# Patient Record
Sex: Male | Born: 1980 | Race: Black or African American | Hispanic: No | Marital: Married | Smoking: Former smoker
Health system: Southern US, Community
[De-identification: ages and names within clinical notes are randomized; demographics above are authoritative.]

## PROBLEM LIST (undated history)

## (undated) DIAGNOSIS — K219 Gastro-esophageal reflux disease without esophagitis: Secondary | ICD-10-CM

## (undated) DIAGNOSIS — K589 Irritable bowel syndrome without diarrhea: Secondary | ICD-10-CM

## (undated) DIAGNOSIS — R7303 Prediabetes: Secondary | ICD-10-CM

## (undated) DIAGNOSIS — E7800 Pure hypercholesterolemia, unspecified: Secondary | ICD-10-CM

## (undated) DIAGNOSIS — E669 Obesity, unspecified: Secondary | ICD-10-CM

## (undated) DIAGNOSIS — J302 Other seasonal allergic rhinitis: Secondary | ICD-10-CM

## (undated) DIAGNOSIS — I10 Essential (primary) hypertension: Secondary | ICD-10-CM

## (undated) HISTORY — PX: SHOULDER SURGERY: SHX246

## (undated) HISTORY — PX: HIP SURGERY: SHX245

---

## 2005-03-28 ENCOUNTER — Emergency Department (HOSPITAL_COMMUNITY): Admission: EM | Admit: 2005-03-28 | Discharge: 2005-03-28 | Payer: Self-pay | Admitting: Emergency Medicine

## 2008-06-10 ENCOUNTER — Emergency Department (HOSPITAL_COMMUNITY): Admission: EM | Admit: 2008-06-10 | Discharge: 2008-06-11 | Payer: Self-pay | Admitting: Emergency Medicine

## 2014-06-01 ENCOUNTER — Encounter (HOSPITAL_COMMUNITY): Payer: Self-pay | Admitting: Physical Medicine and Rehabilitation

## 2014-06-01 ENCOUNTER — Emergency Department (HOSPITAL_COMMUNITY)
Admission: EM | Admit: 2014-06-01 | Discharge: 2014-06-01 | Disposition: A | Discharge status: Home / Self Care | Payer: BC Managed Care – PPO | Attending: Emergency Medicine | Admitting: Emergency Medicine

## 2014-06-01 DIAGNOSIS — I10 Essential (primary) hypertension: Secondary | ICD-10-CM | POA: Insufficient documentation

## 2014-06-01 DIAGNOSIS — K6289 Other specified diseases of anus and rectum: Secondary | ICD-10-CM | POA: Diagnosis present

## 2014-06-01 DIAGNOSIS — K5900 Constipation, unspecified: Secondary | ICD-10-CM

## 2014-06-01 DIAGNOSIS — Z720 Tobacco use: Secondary | ICD-10-CM | POA: Insufficient documentation

## 2014-06-01 DIAGNOSIS — K644 Residual hemorrhoidal skin tags: Secondary | ICD-10-CM

## 2014-06-01 HISTORY — DX: Essential (primary) hypertension: I10

## 2014-06-01 MED ORDER — DOCUSATE SODIUM 100 MG PO CAPS: 100.0000 mg | ORAL_CAPSULE | Freq: Two times a day (BID) | ORAL | Status: DC | PRN

## 2014-06-01 MED ORDER — HYDROCORTISONE ACETATE 25 MG RE SUPP
25.0000 mg | Freq: Two times a day (BID) | RECTAL | Status: DC
Start: 2014-06-01 — End: 2016-08-20

## 2014-06-01 NOTE — Discharge Instructions (Signed)
Read the information below.  Use the prescribed medication as directed.  Please discuss all new medications with your pharmacist.  You may return to the Emergency Department at any time for worsening condition or any new symptoms that concern you.    If you develop uncontrolled pain, fevers, or you are unable to pass gas or have a bowel movement, see your doctor or return to the ER for a recheck.    Hemorrhoids Hemorrhoids are swollen veins around the rectum or anus. There are two types of hemorrhoids:   Internal hemorrhoids. These occur in the veins just inside the rectum. They may poke through to the outside and become irritated and painful.  External hemorrhoids. These occur in the veins outside the anus and can be felt as a painful swelling or hard lump near the anus. CAUSES  Pregnancy.   Obesity.   Constipation or diarrhea.   Straining to have a bowel movement.   Sitting for long periods on the toilet.  Heavy lifting or other activity that caused you to strain.  Anal intercourse. SYMPTOMS   Pain.   Anal itching or irritation.   Rectal bleeding.   Fecal leakage.   Anal swelling.   One or more lumps around the anus.  DIAGNOSIS  Your caregiver may be able to diagnose hemorrhoids by visual examination. Other examinations or tests that may be performed include:   Examination of the rectal area with a gloved hand (digital rectal exam).   Examination of anal canal using a small tube (scope).   A blood test if you have lost a significant amount of blood.  A test to look inside the colon (sigmoidoscopy or colonoscopy). TREATMENT Most hemorrhoids can be treated at home. However, if symptoms do not seem to be getting better or if you have a lot of rectal bleeding, your caregiver may perform a procedure to help make the hemorrhoids get smaller or remove them completely. Possible treatments include:   Placing a rubber band at the base of the hemorrhoid to cut off  the circulation (rubber band ligation).   Injecting a chemical to shrink the hemorrhoid (sclerotherapy).   Using a tool to burn the hemorrhoid (infrared light therapy).   Surgically removing the hemorrhoid (hemorrhoidectomy).   Stapling the hemorrhoid to block blood flow to the tissue (hemorrhoid stapling).  HOME CARE INSTRUCTIONS   Eat foods with fiber, such as whole grains, beans, nuts, fruits, and vegetables. Ask your doctor about taking products with added fiber in them (fibersupplements).  Increase fluid intake. Drink enough water and fluids to keep your urine clear or pale yellow.   Exercise regularly.   Go to the bathroom when you have the urge to have a bowel movement. Do not wait.   Avoid straining to have bowel movements.   Keep the anal area dry and clean. Use wet toilet paper or moist towelettes after a bowel movement.   Medicated creams and suppositories may be used or applied as directed.   Only take over-the-counter or prescription medicines as directed by your caregiver.   Take warm sitz baths for 15-20 minutes, 3-4 times a day to ease pain and discomfort.   Place ice packs on the hemorrhoids if they are tender and swollen. Using ice packs between sitz baths may be helpful.   Put ice in a plastic bag.   Place a towel between your skin and the bag.   Leave the ice on for 15-20 minutes, 3-4 times a day.   Do  not use a donut-shaped pillow or sit on the toilet for long periods. This increases blood pooling and pain.  SEEK MEDICAL CARE IF:  You have increasing pain and swelling that is not controlled by treatment or medicine.  You have uncontrolled bleeding.  You have difficulty or you are unable to have a bowel movement.  You have pain or inflammation outside the area of the hemorrhoids. MAKE SURE YOU:  Understand these instructions.  Will watch your condition.  Will get help right away if you are not doing well or get  worse. Document Released: 12/20/1999 Document Revised: 12/09/2011 Document Reviewed: 10/27/2011 [facility] Patient Information 2015 [place], [place]. This information is not intended to replace advice given to you by your health care provider. Make sure you discuss any questions you have with your health care provider.  Constipation Constipation is when a person has fewer than three bowel movements a week, has difficulty having a bowel movement, or has stools that are dry, hard, or larger than normal. As people grow older, constipation is more common. If you try to fix constipation with medicines that make you have a bowel movement (laxatives), the problem may get worse. Long-term laxative use may cause the muscles of the colon to become weak. A low-fiber diet, not taking in enough fluids, and taking certain medicines may make constipation worse.  CAUSES   Certain medicines, such as antidepressants, pain medicine, iron supplements, antacids, and water pills.   Certain diseases, such as diabetes, irritable bowel syndrome (IBS), thyroid disease, or depression.   Not drinking enough water.   Not eating enough fiber-rich foods.   Stress or travel.   Lack of physical activity or exercise.   Ignoring the urge to have a bowel movement.   Using laxatives too much.  SIGNS AND SYMPTOMS   Having fewer than three bowel movements a week.   Straining to have a bowel movement.   Having stools that are hard, dry, or larger than normal.   Feeling full or bloated.   Pain in the lower abdomen.   Not feeling relief after having a bowel movement.  DIAGNOSIS  Your health care provider will take a medical history and perform a physical exam. Further testing may be done for severe constipation. Some tests may include:  A barium enema X-ray to examine your rectum, colon, and, sometimes, your small intestine.   A sigmoidoscopy to examine your lower colon.   A colonoscopy to examine your  entire colon. TREATMENT  Treatment will depend on the severity of your constipation and what is causing it. Some dietary treatments include drinking more fluids and eating more fiber-rich foods. Lifestyle treatments may include regular exercise. If these diet and lifestyle recommendations do not help, your health care provider may recommend taking over-the-counter laxative medicines to help you have bowel movements. Prescription medicines may be prescribed if over-the-counter medicines do not work.  HOME CARE INSTRUCTIONS   Eat foods that have a lot of fiber, such as fruits, vegetables, whole grains, and beans.  Limit foods high in fat and processed sugars, such as french fries, hamburgers, cookies, candies, and soda.   A fiber supplement may be added to your diet if you cannot get enough fiber from foods.   Drink enough fluids to keep your urine clear or pale yellow.   Exercise regularly or as directed by your health care provider.   Go to the restroom when you have the urge to go. Do not hold it.   Only take  over-the-counter or prescription medicines as directed by your health care provider. Do not take other medicines for constipation without talking to your health care provider first.  SEEK IMMEDIATE MEDICAL CARE IF:   You have bright red blood in your stool.   Your constipation lasts for more than 4 days or gets worse.   You have abdominal or rectal pain.   You have thin, pencil-like stools.   You have unexplained weight loss. MAKE SURE YOU:   Understand these instructions.  Will watch your condition.  Will get help right away if you are not doing well or get worse. Document Released: 09/20/2003 Document Revised: 12/27/2012 Document Reviewed: 10/03/2012 [facility] Patient Information 2015 [place], [place]. This information is not intended to replace advice given to you by your health care provider. Make sure you discuss any questions you have with your health care  provider.

## 2014-06-01 NOTE — ED Provider Notes (Signed)
CSN: [id number]     Arrival date & time 06/01/14  1410 History  This chart was scribed for Glenn Dredge, PA-C working with Glenn Nay, MD by Glenn Martin, ED Scribe. This patient was seen in room TR10C/TR10C and the patient's care was started at 2:47 PM.      Chief Complaint  Patient presents with  . Rectal Pain  . Hemorrhoids   The history is provided by the patient. No language interpreter was used.   HPI Comments: Glenn Martin is a 34 y.o. male who presents to the Emergency Department complaining of worsening rectal pain that 3-4 days ago. Pt describes the pain as burning such as dry skin. Pt states that he is constipated and states that when he does have a BM his stools are loose. Pt states that the pain is worse when sitting and having a BM. Pt states that he did notice some blood when wiping a few days ago. Denies fever, chills, abdominal pain or diarrhea. Pt denies Hx of hemorrhoids.   Past Medical History  Diagnosis Date  . Hypertension    History reviewed. No pertinent past surgical history. No family history on file. History  Substance Use Topics  . Smoking status: Current Every Day Smoker  . Smokeless tobacco: Not on file  . Alcohol Use: Yes    Review of Systems  Constitutional: Negative for fever and chills.  Gastrointestinal: Positive for constipation. Negative for vomiting, abdominal pain and diarrhea.  Genitourinary:       Rectal pain.   Allergic/Immunologic: Negative for immunocompromised state.  Hematological: Does not bruise/bleed easily.  Psychiatric/Behavioral: Negative for self-injury.    Allergies  Review of patient's allergies indicates no known allergies.  Home Medications   Prior to Admission medications   Not on File   BP 144/105 mmHg  Pulse 76  Temp(Src) 97.8 F (36.6 C) (Oral)  Resp 18  SpO2 97%   Physical Exam  Constitutional: He appears well-developed and well-nourished. No distress.  HENT:  Head: Normocephalic and atraumatic.   Neck: Neck supple.  Pulmonary/Chest: Effort normal.  Genitourinary: Rectal exam shows external hemorrhoid.  Single soft hemorrhoid at anal verge, no erythema warmth or discharge, no evidence of thrombosis or active bleeding, mildly tender to palpation.  Chaperone Present.   Neurological: He is alert.  Skin: He is not diaphoretic.  Nursing note and vitals reviewed.   ED Course  Procedures (including critical care time) DIAGNOSTIC STUDIES: Oxygen Saturation is 97% on RA, normal by my interpretation.    COORDINATION OF CARE: 3:14 PM-Discussed treatment plan with pt at bedside and pt agreed to plan.     Labs Review Labs Reviewed - No data to display  Imaging Review No results found.   EKG Interpretation None      MDM   Final diagnoses:  External hemorrhoid  Constipation, unspecified constipation type    Afebrile, nontoxic patient with single external hemorrhoid, mostly outside, partially within anal sphincter.  No e/o thrombosis.  No active bleeding.  No e/o infection.   D/C home with anusol, colace, referral to general surgery if needed.   Discussed result, findings, treatment, and follow up  with patient.  Pt given return precautions.  Pt verbalizes understanding and agrees with plan.         I personally performed the services described in this documentation, which was scribed in my presence. The recorded information has been reviewed and is accurate.      Glenn Dredge, PA-C 06/01/14 1702  Glenn Maduro  Glenn PaxBeaton, MD 06/05/14 [phone]

## 2014-06-01 NOTE — ED Notes (Signed)
Pt presents to department for evaluation of rectal pain, increases with bowel movements, also states constipation. Pt is alert and oriented x4.

## 2014-10-05 ENCOUNTER — Emergency Department (HOSPITAL_COMMUNITY)
Admission: EM | Admit: 2014-10-05 | Discharge: 2014-10-05 | Disposition: A | Discharge status: Home / Self Care | Payer: BC Managed Care – PPO | Attending: Emergency Medicine | Admitting: Emergency Medicine

## 2014-10-05 ENCOUNTER — Encounter (HOSPITAL_COMMUNITY): Payer: Self-pay | Admitting: Emergency Medicine

## 2014-10-05 DIAGNOSIS — Z760 Encounter for issue of repeat prescription: Secondary | ICD-10-CM | POA: Diagnosis not present

## 2014-10-05 DIAGNOSIS — H748X3 Other specified disorders of middle ear and mastoid, bilateral: Secondary | ICD-10-CM | POA: Insufficient documentation

## 2014-10-05 DIAGNOSIS — R110 Nausea: Secondary | ICD-10-CM | POA: Diagnosis present

## 2014-10-05 DIAGNOSIS — Z720 Tobacco use: Secondary | ICD-10-CM | POA: Diagnosis not present

## 2014-10-05 DIAGNOSIS — I10 Essential (primary) hypertension: Secondary | ICD-10-CM | POA: Insufficient documentation

## 2014-10-05 DIAGNOSIS — J0110 Acute frontal sinusitis, unspecified: Secondary | ICD-10-CM | POA: Insufficient documentation

## 2014-10-05 MED ORDER — AMOXICILLIN 500 MG PO CAPS: 500.0000 mg | ORAL_CAPSULE | Freq: Three times a day (TID) | ORAL | Status: DC

## 2014-10-05 MED ORDER — LISINOPRIL-HYDROCHLOROTHIAZIDE 20-25 MG PO TABS
1.0000 | ORAL_TABLET | Freq: Every day | ORAL | Status: DC
Start: 2014-10-05 — End: 2015-06-05

## 2014-10-05 NOTE — Discharge Instructions (Signed)
Take the prescribed medication as directed. Follow-up with a primary care physician-- resource guide attached to help with this. Return to the ED for new or worsening symptoms.   Emergency Department Resource Guide 1) Find a Doctor and Pay Out of Pocket Although you won't have to find out who is covered by your insurance plan, it is a good idea to ask around and get recommendations. You will then need to call the office and see if the doctor you have chosen will accept you as a new patient and what types of options they offer for patients who are self-pay. Some doctors offer discounts or will set up payment plans for their patients who do not have insurance, but you will need to ask so you aren't surprised when you get to your appointment.  2) Contact Your Local Health Department Not all health departments have doctors that can see patients for sick visits, but many do, so it is worth a call to see if yours does. If you don't know where your local health department is, you can check in your phone book. The CDC also has a tool to help you locate your state's health department, and many state websites also have listings of all of their local health departments.  3) Find a Walk-in Clinic If your illness is not likely to be very severe or complicated, you may want to try a walk in clinic. These are popping up all over the country in pharmacies, drugstores, and shopping centers. They're usually staffed by nurse practitioners or physician assistants that have been trained to treat common illnesses and complaints. They're usually fairly quick and inexpensive. However, if you have serious medical issues or chronic medical problems, these are probably not your best option.  No Primary Care Doctor: - Call Health Connect at  [phone] - they can help you locate a primary care doctor that  accepts your insurance, provides certain services, etc. - Physician Referral Service- [phone]  Chronic Pain  Problems: Organization         Address  Phone   Notes  [facility]  [phone] Patients need to be referred by their primary care doctor.   Medication Assistance: Organization         Address  Phone   Notes  [facility] Medication [facility] [address] [place] [place], [place] [address] [phone] --Must be a resident of [facility] -- Must have NO insurance coverage whatsoever (no Medicaid/ Medicare, etc.) -- The pt. MUST have a primary care doctor that directs their care regularly and follows them in the community   [facility]  [phone]   [employer]  [phone]    Agencies that provide inexpensive medical care: Organization         Address  Phone   Notes  [facility]  [phone]   [facility]    [phone]   [facility] [address] [place], [place] [address] [phone]   [facility] [address] [place], [place] [phone]   Planned Parenthood    [phone]   [facility]    [phone]   Community Health and [facility]  [address], [place] Phone:  [phone], Fax:  [phone] Hours of Operation:  9 am - 6 pm, M-F.  Also accepts Medicaid/Medicare and self-pay.  [facility]  [address],  [address], [place] Phone: [phone], Fax: [phone]. Hours of Operation:  8:30 am - 5:30 pm, M-F.  Also accepts Medicaid and self-pay.  [facility] [place] [address] [place] Phone: [phone]   [place], [place], [place] [phone], Ext. [phone] Mondays & Thursdays: 7-9 AM.  First 15 patients are seen on a first come, first serve basis.    [place] Providers:  Organization         Address  Phone   Notes  [facility] [address], [place] [phone] Also  accepts self-pay patients.  [facility] [address], [place]  [phone]   [place], [address], [place] [phone]   [facility] [address] [place] [phone]   Lucianne Lei [address], [place]   [phone] Only accepts [place] Access [place] patients after they have their name applied to their card.   Self-Pay (no insurance) in [facility]:  Organization         Address  Phone   Notes  Sickle Cell Patients, [facility] Internal Medicine [place] [phone]   [facility] [place] [phone]   [facility]  [place], [place], [place] [phone]   [facility]/Dr. Osei-Bonsu  [address] [place] [address], [place] or [address], [place] [phone] Phone number for both [place] and [place] locations is the same.  [facility] and [facility] [address] [place] [phone]   [facility] [address], [place] or [address] [phone]   [facility] [address], [place] [phone], phone; [phone], fax Sees patients 1st and 3rd Saturday of every month.  Must not qualify for public or private insurance (i.e. Medicaid, Medicare, [place] Health Choice, Veterans' Benefits)  Household income should be no more than 200% of the poverty level The clinic cannot treat you if you are pregnant or think you are pregnant  Sexually transmitted diseases are not treated at the clinic.    Dental Care: Organization         Address  Phone  Notes  [facility] Department of [facility] [phone] Accepts children up to age 14 who are enrolled in [place] or [place]; pregnant  women with a Medicaid card; and children who have applied for Medicaid or [place] Health Choice, but were declined, whose parents can pay a reduced fee at time of service.  [facility] Department of [facility]  [address], [place] [phone] Accepts children up to age 57 who are enrolled in [place] or [place]; pregnant women with a Medicaid card; and children who have applied for Medicaid or [place] Health Choice, but were declined, whose parents can pay a reduced fee at time of service.  [facility]  [place] [phone] Patients are seen by appointment only. Walk-ins are not accepted. [place] will see patients 65 years of age and older. Monday - Tuesday (8am-5pm) Most Wednesdays (8:30-5pm) $30 per visit, cash only  [place]  [address], [place] [phone] Patients are seen by appointment only. Walk-ins are not accepted. [facility] will see patients 18 years of age and older. °One Wednesday Evening (Monthly: Volunteer Based).  $30 per visit, cash only  °[facility]  [phone] for adults; Children under age 4, call Graduate Pediatric Dentistry at [phone]. Children aged 4-14, please call [phone] to request a pediatric application. ° Dental services are provided in all areas of dental care including fillings, crowns and bridges, complete and partial dentures, implants, gum treatment, root canals, and extractions. Preventive care is also provided. Treatment is provided to both adults and children. °Patients are selected via a lottery and there is often a waiting list. °  °[facility] [address], °[place] ° ([phone] [email or web address].[email or web address] °  °[facility] [address], [place], [place] [phone], Ext. [phone] Second and Fourth Thursday of each month, opens at 6:30 AM; Clinic ends at 9 AM.  Patients are  seen on a first-come first-served basis, and a limited number are seen during each clinic.  ° °[facility] ° [address], [place], [place] [phone]   Eligibility Requirements °You must have lived in [place], [place], or [place] counties for at least the last three months. °  You cannot be eligible for state or federal sponsored healthcare insurance, including Veterans Administration, Medicaid, or Medicare. °  You generally cannot be eligible for healthcare insurance through your employer.  °  How to apply: °Eligibility screenings are held every Tuesday and Wednesday afternoon from 1:00 pm until 4:00 pm. You do not need an appointment for the interview!  °[facility] [address], [place], [place] [phone]   °[facility]  [phone]   °[facility]  [phone]   °[facility]  336-570-6415   ° °Behavioral Health Resources in the Community: °Intensive Outpatient Programs °Organization         Address  Phone  Notes  °[facility] [address], [place], [place] [phone]   °[facility] [address], [place], [place] [phone]   °[facility] [address], [place], [place] ° [phone]   °[facility] [address],  °[place], [place] 1-800-853-5163 or 336-641-4981   °Substance Abuse Resources °Organization         Address  Phone  Notes  °[facility]  336-882-2125   °[facility]  336-784-9470   °[facility]  336-285-9073   °[facility]  336-845-3988   °Residential & Outpatient Substance Abuse Program  1-800-659-3381   °Psychological Services °Organization         Address  Phone  Notes  ° Health  336- [phone]   °[facility]  336- 378-7881   °[facility] [address], [place] 1-800-853-5163 or 336-641-4981   ° °Mobile Crisis  Teams °Organization         Address  Phone  Notes  °[facility], Mobile Crisis Care Unit  1-877-626-1772   °Assertive °Psychotherapeutic Services ° [address] [place], [place] 336-834-9664   °Sharon DeEsch [address] °[place] 336-554-5454   ° °Self-Help/Support Groups °Organization         Address  Phone             Notes  °[facility] [place] -   variety of support groups  336- 336-389-6044 Call for more information  Narcotics Anonymous (NA), [facility] [address], [employer] [place]  2 meetings at this location   Residential Facilities manager         Address  Phone  Notes  [facility] [place],    [place]  [phone]   [facility]  [address] [place] [phone], [place], [place]   [place], [place] [phone] Admissions: 8am-3pm M-F  [facility] [place] [address],    [place], [place] [phone]   [facility] [address] [place], [place], [place]   [facility] [address]  [place], [place]   [facility] - Intensive Outpatient [address], [place], [place]   [facility] [place] [address]  [place], [place] 1-640-386-3300 or [phone]   [facility] ([facility]) [address] [place], [place] Accepts Medicaid  [facility] [place] [address],  [place] 1-646-193-9807 Substance Abuse/Addiction Treatment   [facility] Organization         Address  Phone  Notes  [facility]  [phone]   Domenic Schwab, PhD [address] [place], [place]   ([phone]) [phone] or ([phone]   [facility] [place], [place] [phone]   [facility] [address] [place], [place] [phone]  Insurance/Medicaid/sponsorship through [facility] and Families [address]                                    [place], [place] [phone] [place] [address], [place] [phone]    Dr. Adele Schilder  [phone]   [facility] Dept. 1) [address] [place] 2) [place] 3)  [place] [address], [place] [phone]  [phone]   [place] [phone] or [phone] (After Hours)

## 2014-10-05 NOTE — ED Notes (Signed)
Running nose/stuffed up nose/ sneezing/ no fever/ no chills/ coughing up clear and yellowish mucus.

## 2014-10-05 NOTE — ED Notes (Addendum)
Ptc/o "URI symptoms". Pt states headache, cough with productive yellow phlegm and right ear pain.   Pt also states that he has HTN but couldn't doesn't have a PCP and wants a prescription for Lisinopril.

## 2014-10-05 NOTE — ED Provider Notes (Signed)
CSN: [id number]     Arrival date & time 10/05/14  1303 History  By signing my name below, I, Tanda Rockers, attest that this documentation has been prepared under the direction and in the presence of Sharilyn Sites, PA-C. Electronically Signed: Tanda Rockers, ED Scribe. 10/05/2014. 1:31 PM.  Chief Complaint  Patient presents with  . URI  . Medication Refill   The history is provided by the patient. No language interpreter was used.     HPI Comments: Glenn Martin is a 34 y.o. male with hx HTN who presents to the Emergency Department complaining of URI symptoms x 2-3 days, worsening recently. Pt complains of headache, nasal congestion, rhinorrhea, sneezing, productive cough with yellow phlegm, right ear pain, chills, and nausea. He has had recent contact with similar symptoms with his girlfriend. Denies fever, vomiting, chest pain, SOB, palpitations, dizziness, weakness or any other associated symptoms. Pt has hx of HTN, usually takes Lisinopril-HCTZ and has been out for the past 1.5 months. He does not have a PCP because he recently transferred here for work and would like a prescription.   Past Medical History  Diagnosis Date  . Hypertension    History reviewed. No pertinent past surgical history. No family history on file. Social History  Substance Use Topics  . Smoking status: Current Every Day Smoker  . Smokeless tobacco: None  . Alcohol Use: Yes    Review of Systems  Constitutional: Positive for chills. Negative for fever.  HENT: Positive for congestion, ear pain (Right), rhinorrhea and sneezing.   Respiratory: Positive for cough.   Gastrointestinal: Positive for nausea. Negative for vomiting.  Neurological: Positive for headaches.  All other systems reviewed and are negative.  Allergies  Review of patient's allergies indicates no known allergies.  Home Medications   Prior to Admission medications   Medication Sig Start Date End Date Taking? Authorizing Provider   docusate sodium (COLACE) 100 MG capsule Take 1 capsule (100 mg total) by mouth 2 (two) times daily as needed for mild constipation or moderate constipation. 06/01/14   Trixie Dredge, PA-C  hydrocortisone (ANUSOL-HC) 25 MG suppository Place 1 suppository (25 mg total) rectally 2 (two) times daily. For 7 days 06/01/14   Trixie Dredge, PA-C   Triage Vitals: BP 161/91 mmHg  Pulse 98  Temp(Src) 98.2 F (36.8 C) (Oral)  Resp 19  Ht  (1.803 m)  Wt 292 lb (132.45 kg)  BMI 40.74 kg/m2  SpO2 97%   Physical Exam  Constitutional: He is oriented to person, place, and time. He appears well-developed and well-nourished. No distress.  HENT:  Head: Normocephalic and atraumatic.  Right Ear: Ear canal normal. A middle ear effusion is present.  Left Ear: Ear canal normal. Tympanic membrane is erythematous. A middle ear effusion is present.  Nose: Mucosal edema and rhinorrhea (clear) present. Right sinus exhibits maxillary sinus tenderness and frontal sinus tenderness. Left sinus exhibits maxillary sinus tenderness and frontal sinus tenderness.  Mouth/Throat: Uvula is midline, oropharynx is clear and moist and mucous membranes are normal. No oropharyngeal exudate, posterior oropharyngeal edema, posterior oropharyngeal erythema or tonsillar abscesses.  Eyes: Conjunctivae and EOM are normal. Pupils are equal, round, and reactive to light.  Neck: Normal range of motion and full passive range of motion without pain. Neck supple. No spinous process tenderness and no muscular tenderness present. No rigidity.  Cardiovascular: Normal rate, regular rhythm and normal heart sounds.   Pulmonary/Chest: Effort normal and breath sounds normal. No respiratory distress. He has no wheezes.  Abdominal: Soft. Bowel sounds are normal. There is no tenderness. There is no guarding.  Musculoskeletal: Normal range of motion. He exhibits no edema.  Neurological: He is alert and oriented to person, place, and time.  AAOx3, answering  questions appropriately; equal strength UE and LE bilaterally; CN grossly intact; moves all extremities appropriately without ataxia; no focal neuro deficits or facial asymmetry appreciated  Skin: Skin is warm and dry. He is not diaphoretic.  Psychiatric: He has a normal mood and affect.  Nursing note and vitals reviewed.   ED Course  Procedures (including critical care time)  DIAGNOSTIC STUDIES: Oxygen Saturation is 97% on RA, normal by my interpretation.    COORDINATION OF CARE: 1:31 PM-Discussed treatment plan which includes RX Lisinopril-HCTZ and referral for PCP with pt at bedside and pt agreed to plan.   Labs Review Labs Reviewed - No data to display  Imaging Review No results found.   EKG Interpretation None      MDM   Final diagnoses:  Acute frontal sinusitis, recurrence not specified  Medication refill   34 y.o. M here with URI type symptoms for the past 3 days.  Sick contacts noted at home-- girlfriend. Patient afebrile, non-toxic.  No focal neurologic deficits or clinical signs of meningitis.  Appears to have sinusitis.  Lungs CTAB without wheezes or rhonchi to suggest pneumonia.  VSS without respiratory distress.  Will start on amoxicillin, refill lisinopril-hctz.  Recommended to establish care with PCP, resource guide provided.  Discussed plan with patient, he/she acknowledged understanding and agreed with plan of care.  Return precautions given for new or worsening symptoms.  I personally performed the services described in this documentation, which was scribed in my presence. The recorded information has been reviewed and is accurate.    Garlon Hatchet, PA-C 10/05/14 1351  Alvira Monday, MD 10/08/14 1442

## 2014-10-05 NOTE — ED Notes (Signed)
Pt given discharge  

## 2014-12-30 ENCOUNTER — Emergency Department (HOSPITAL_COMMUNITY)
Admission: EM | Admit: 2014-12-30 | Discharge: 2014-12-30 | Disposition: A | Discharge status: Home / Self Care | Payer: BC Managed Care – PPO | Attending: Emergency Medicine | Admitting: Emergency Medicine

## 2014-12-30 ENCOUNTER — Encounter (HOSPITAL_COMMUNITY): Payer: Self-pay | Admitting: Vascular Surgery

## 2014-12-30 DIAGNOSIS — Y92410 Unspecified street and highway as the place of occurrence of the external cause: Secondary | ICD-10-CM | POA: Insufficient documentation

## 2014-12-30 DIAGNOSIS — I10 Essential (primary) hypertension: Secondary | ICD-10-CM | POA: Diagnosis not present

## 2014-12-30 DIAGNOSIS — S3992XA Unspecified injury of lower back, initial encounter: Secondary | ICD-10-CM | POA: Diagnosis present

## 2014-12-30 DIAGNOSIS — Y998 Other external cause status: Secondary | ICD-10-CM | POA: Insufficient documentation

## 2014-12-30 DIAGNOSIS — Z79899 Other long term (current) drug therapy: Secondary | ICD-10-CM | POA: Insufficient documentation

## 2014-12-30 DIAGNOSIS — M545 Low back pain, unspecified: Secondary | ICD-10-CM

## 2014-12-30 DIAGNOSIS — Y9389 Activity, other specified: Secondary | ICD-10-CM | POA: Insufficient documentation

## 2014-12-30 DIAGNOSIS — Z792 Long term (current) use of antibiotics: Secondary | ICD-10-CM | POA: Diagnosis not present

## 2014-12-30 DIAGNOSIS — F17210 Nicotine dependence, cigarettes, uncomplicated: Secondary | ICD-10-CM | POA: Diagnosis not present

## 2014-12-30 MED ORDER — METHOCARBAMOL 500 MG PO TABS: 500.0000 mg | ORAL_TABLET | Freq: Two times a day (BID) | ORAL | Status: DC

## 2014-12-30 MED ORDER — IBUPROFEN 800 MG PO TABS: 800.0000 mg | ORAL_TABLET | Freq: Three times a day (TID) | ORAL | Status: DC

## 2014-12-30 NOTE — Discharge Instructions (Signed)
1. Medications: robaxin, ibuprofen, usual home medications °2. Treatment: rest, drink plenty of fluids, gentle stretching as discussed, alternate ice and heat °3. Follow Up: Please followup with your primary doctor in 3 days for discussion of your diagnoses and further evaluation after today's visit; if you do not have a primary care doctor use the resource guide provided to find one;  Return to the ER for worsening back pain, difficulty walking, loss of bowel or bladder control or other concerning symptoms ° ° ° °Back Exercises °The following exercises strengthen the muscles that help to support the back. They also help to keep the lower back flexible. Doing these exercises can help to prevent back pain or lessen existing pain. °If you have back pain or discomfort, try doing these exercises 2-3 times each day or as told by your health care provider. When the pain goes away, do them once each day, but increase the number of times that you repeat the steps for each exercise (do more repetitions). If you do not have back pain or discomfort, do these exercises once each day or as told by your health care provider. °EXERCISES °Single Knee to Chest °Repeat these steps 3-5 times for each leg: °1. Lie on your back on a firm bed or the floor with your legs extended. °2. Bring one knee to your chest. Your other leg should stay extended and in contact with the floor. °3. Hold your knee in place by grabbing your knee or thigh. °4. Pull on your knee until you feel a gentle stretch in your lower back. °5. Hold the stretch for 10-30 seconds. °6. Slowly release and straighten your leg. °Pelvic Tilt °Repeat these steps 5-10 times: °1. Lie on your back on a firm bed or the floor with your legs extended. °2. Bend your knees so they are pointing toward the ceiling and your feet are flat on the floor. °3. Tighten your lower abdominal muscles to press your lower back against the floor. This motion will tilt your pelvis so your tailbone  points up toward the ceiling instead of pointing to your feet or the floor. °4. With gentle tension and even breathing, hold this position for 5-10 seconds. °Cat-Cow °Repeat these steps until your lower back becomes more flexible: °1. Get into a hands-and-knees position on a firm surface. Keep your hands under your shoulders, and keep your knees under your hips. You may place padding under your knees for comfort. °2. Let your head hang down, and point your tailbone toward the floor so your lower back becomes rounded like the back of a cat. °3. Hold this position for 5 seconds. °4. Slowly lift your head and point your tailbone up toward the ceiling so your back forms a sagging arch like the back of a cow. °5. Hold this position for 5 seconds. °Press-Ups °Repeat these steps 5-10 times: °1. Lie on your abdomen (face-down) on the floor. °2. Place your palms near your head, about shoulder-width apart. °3. While you keep your back as relaxed as possible and keep your hips on the floor, slowly straighten your arms to raise the top half of your body and lift your shoulders. Do not use your back muscles to raise your upper torso. You may adjust the placement of your hands to make yourself more comfortable. °4. Hold this position for 5 seconds while you keep your back relaxed. °5. Slowly return to lying flat on the floor. °Bridges °Repeat these steps 10 times: °1. Lie on your back   on a firm surface. °2. Bend your knees so they are pointing toward the ceiling and your feet are flat on the floor. °3. Tighten your buttocks muscles and lift your buttocks off of the floor until your waist is at almost the same height as your knees. You should feel the muscles working in your buttocks and the back of your thighs. If you do not feel these muscles, slide your feet 1-2 inches farther away from your buttocks. °4. Hold this position for 3-5 seconds. °5. Slowly lower your hips to the starting position, and allow your buttocks muscles to  relax completely. °If this exercise is too easy, try doing it with your arms crossed over your chest. °Abdominal Crunches °Repeat these steps 5-10 times: °1. Lie on your back on a firm bed or the floor with your legs extended. °2. Bend your knees so they are pointing toward the ceiling and your feet are flat on the floor. °3. Cross your arms over your chest. °4. Tip your chin slightly toward your chest without bending your neck. °5. Tighten your abdominal muscles and slowly raise your trunk (torso) high enough to lift your shoulder blades a tiny bit off of the floor. Avoid raising your torso higher than that, because it can put too much stress on your low back and it does not help to strengthen your abdominal muscles. °6. Slowly return to your starting position. °Back Lifts °Repeat these steps 5-10 times: °1. Lie on your abdomen (face-down) with your arms at your sides, and rest your forehead on the floor. °2. Tighten the muscles in your legs and your buttocks. °3. Slowly lift your chest off of the floor while you keep your hips pressed to the floor. Keep the back of your head in line with the curve in your back. Your eyes should be looking at the floor. °4. Hold this position for 3-5 seconds. °5. Slowly return to your starting position. °SEEK MEDICAL CARE IF: °· Your back pain or discomfort gets much worse when you do an exercise. °· Your back pain or discomfort does not lessen within 2 hours after you exercise. °If you have any of these problems, stop doing these exercises right away. Do not do them again unless your health care provider says that you can. °SEEK IMMEDIATE MEDICAL CARE IF: °· You develop sudden, severe back pain. If this happens, stop doing the exercises right away. Do not do them again unless your health care provider says that you can. °  °This information is not intended to replace advice given to you by your health care provider. Make sure you discuss any questions you have with your health  care provider. °  °Document Released: 01/30/2004 Document Revised: 09/12/2014 Document Reviewed: 02/15/2014 °Elsevier Interactive Patient Education ©2016 Elsevier Inc. ° ° ° °Emergency Department Resource Guide °1) Find a Doctor and Pay Out of Pocket °Although you won't have to find out who is covered by your insurance plan, it is a good idea to ask around and get recommendations. You will then need to call the office and see if the doctor you have chosen will accept you as a new patient and what types of options they offer for patients who are self-pay. Some doctors offer discounts or will set up payment plans for their patients who do not have insurance, but you will need to ask so you aren't surprised when you get to your appointment. ° °2) Contact Your Local Health Department °Not all health departments have doctors   that can see patients for sick visits, but many do, so it is worth a call to see if yours does. If you don't know where your local health department is, you can check in your phone book. The CDC also has a tool to help you locate your state's health department, and many state websites also have listings of all of their local health departments. ° °3) Find a Walk-in Clinic °If your illness is not likely to be very severe or complicated, you may want to try a walk in clinic. These are popping up all over the country in pharmacies, drugstores, and shopping centers. They're usually staffed by nurse practitioners or physician assistants that have been trained to treat common illnesses and complaints. They're usually fairly quick and inexpensive. However, if you have serious medical issues or chronic medical problems, these are probably not your best option. ° °No Primary Care Doctor: °- Call [facility] at  832-8000 - they can help you locate a primary care doctor that  accepts your insurance, provides certain services, etc. °- Physician Referral Service- 1-800-533-3463 ° °Chronic Pain  Problems: °Organization         Address  Phone   Notes  °[facility]  (336) 297-2271 Patients need to be referred by their primary care doctor.  ° °Medication Assistance: °Organization         Address  Phone   Notes  °[place] [facility] [address] °[place], [place] [address] (336) 641-8030 --Must be a resident of [place] °-- Must have NO insurance coverage whatsoever (no Medicaid/ Medicare, etc.) °-- The pt. MUST have a primary care doctor that directs their care regularly and follows them in the community °  °[facility]  (866) 331-1348   °[employer]  (888) 892-1162   ° °Agencies that provide inexpensive medical care: °Organization         Address  Phone   Notes  °[facility]  (336) 832-8035   °[facility]    (336) 832-7272   °[facility] [address] °[place], [place] [address] (336) 832-4777   °[facility] [address], °[place] (336) 271-4999   °[facility]    (336) 373-0678   °[facility]    (336) 272-1050   °[facility] ° [address], [place] Phone:  (336) 832-4444, Fax:  (336) 832-4440 Hours of Operation:  9 am - 6 pm, M-F.  Also accepts Medicaid/Medicare and self-pay.  °[facility] ° [address], [place] Phone: (336) 832-3150, Fax: (336) [phone]. Hours of Operation:  8:30 am - 5:30 pm, M-F.  Also accepts Medicaid and self-pay.  °[facility] [place] [address], [place] Phone: (336) 878-6027   °[facility] [address], [place], [place] (336)723-1848, Ext. 123 Mondays & Thursdays: 7-9 AM.  First 15 patients are seen on a first come, first serve basis. °  ° °Medicaid-accepting [place] Providers: ° °Organization         Address  Phone   Notes  °[facility] [address], [place] (336) 641-2100 Also  accepts self-pay patients.  °[facility] [address], [place] ° (336) 856-9996   °[facility] [address],  (336) 288-8857   °[facility] [address] [place] [address], [place] (336) 299-7000   °Veita Bland [address], [place]  ° (336) 373-1557 Only accepts [place] Access Medicaid patients after they have their name applied to their card.  ° °Self-Pay (no insurance) in [place]: ° °Organization         Address  Phone   Notes  °Sickle Cell Patients, [facility] [address], [place] (336) 832-1970   °[facility] [address], [place] (336) 832-4400   °[facility] ° [address], [place] (336) 992-4800   °[facility]/Dr. Osei-Bonsu ° [address] [place] [address], [place] or [address], [place] (336) 841-8500 Phone number for both [place] and [place] locations is the same.  °[facility] [address], [place] (336) 299-0000   °[facility] [address] [place] [address], [place] or [address] (336) 852-7530 °(336) 878-2260   °[facility] [address], [place] (336) 350-1642, phone; (336) 294-5005, fax Sees patients 1st and 3rd Saturday of every month.  Must not qualify for public or private insurance (i.e. Medicaid, Medicare, [place] Health Choice, Veterans' Benefits) • Household income should be no more than 200% of the poverty level •The clinic cannot treat you if you are pregnant or think you are pregnant • Sexually transmitted diseases are not treated at the clinic.  ° ° °Dental Care: °Organization         Address  Phone  Notes  °[facility] [address], [place] (336) 641-6152 Accepts children up to age 21 who are enrolled in Medicaid or [place] Health Choice; pregnant  women with a Medicaid card; and children who have applied for Medicaid or [place] Health Choice, but were declined, whose parents can pay a reduced fee at time of service.  °[facility] [place]  [address], [place] (336) 641-7733 Accepts children up to age 21 who are enrolled in Medicaid or [place] Health Choice; pregnant women with a Medicaid card; and children who have applied for Medicaid or [place] Health Choice, but were declined, whose parents can pay a reduced fee at time of service.  °[facility] ° [address], [place] (336) 641-4533 Patients are seen by appointment only. Walk-ins are not accepted. [facility] will see patients 18 years of age and older. °Monday - Tuesday (8am-5pm) °Most Wednesdays (8:30-5pm) °$30 per visit, cash only  °[facility] ° [address], [place] (336) 641-4533 Patients are seen by appointment only. Walk-ins are not accepted. [facility] will see patients 18 years of age and older. °One Wednesday Evening (Monthly: Volunteer Based).  $30 per visit, cash only  °[facility]  (919) 537-3737 for adults; Children under age 4, call Graduate Pediatric Dentistry at (919) [phone]. Children aged 4-14, please call (919) 537-3737 to request a pediatric application. ° Dental services are provided in all areas of dental care including fillings, crowns and bridges, complete and partial dentures, implants, gum treatment, root canals, and extractions. Preventive care is also provided. Treatment is provided to both adults and children. °Patients are selected via a lottery and there is often a waiting list. °  °[facility] [address], ° ° (336) 763-8833 [email or web address] °  °[facility] [address], [place], [place] (336)723-1848, Ext. 123 Second and Fourth Thursday of each month, opens at 6:30 AM; Clinic ends at 9 AM.  Patients are  seen on a first-come first-served basis, and a limited number are seen during each clinic.  ° °[facility] ° [address], [place], [place] (336) 723-7904   Eligibility Requirements °You must have lived in [place], or [place] counties for at least the last three months. °  You cannot be eligible for state or federal sponsored healthcare insurance, including Veterans Administration, Medicaid, or Medicare. °  You generally cannot be eligible for healthcare insurance through your employer.  °  How to apply: °Eligibility screenings are held every Tuesday and Wednesday afternoon from 1:00 pm until 4:00 pm. You do not need an appointment for the interview!  °[facility] [address], [place], [place] 336-631-2330   °[facility]  336-342-8273   °[facility]  336-703-3100   °[facility]  336-570-6415   ° °Behavioral Health Resources in the Community: °Intensive Outpatient Programs °Organization         Address  Phone  Notes  °[facility] [address], [place], [place] 336-878-6098   °[facility] [address], [place], [place] 336-832-9800   °[facility] [address], [place], [place] ° 336-882-2125   °[facility] [address],  °[place], [place] 1-800-853-5163 or 336-641-4981   °Substance Abuse Resources °Organization         Address  Phone  Notes  °[facility]  336-882-2125   °[facility]  336-784-9470   °[facility]  336-285-9073   °[facility]  336-845-3988   °Residential & Outpatient Substance Abuse Program  1-800-659-3381   °Psychological Services °Organization         Address  Phone  Notes  °[facility]  336- 832-9600   °[facility]  336- 378-7881   °[facility] [address], [place] 1-800-853-5163 or 336-641-4981   ° °Mobile Crisis  Teams °Organization         Address  Phone  Notes  °[facility], Mobile Crisis Care Unit  1-877-626-1772   °Assertive °[facility] ° [address] [place], [place] 336-834-9664   °Sharon DeEsch [address] °[place] 336-554-5454   ° °Self-Help/Support Groups °Organization         Address  Phone             Notes  °[facility] [place] - variety of support groups  336- 373-1402 Call for more information  °Narcotics Anonymous (NA), [facility] [address], °[place]  2 meetings at this location  ° °Residential Treatment Programs °Organization         Address  Phone  Notes  °[facility] [address],    °[place]  1-866-801-8205   °[facility] ° [address], [place], [place] 704-293-8524   °[facility] [address], [place] 336-845-3988 Admissions: 8am-3pm M-F  °[facility] [address]    °[place], [place] 336-841-1104   °[facility] [address], [place], [place] 336-379-7146   °[facility] [address]  °[place],  336-285-9073   °[facility] Intensive Outpatient [address], [place],  336-852-3033   °  [facility] [address]  °[place], [place] 1-877-615-2722 or 336-784-9470   °[facility] [address] [place], [place] 336-227-7417 Accepts Medicaid  °[facility] [address]  °[place] 1-800-659-3381 Substance Abuse/Addiction Treatment  ° °[place] Behavioral Health Resources °Organization         Address  Phone  Notes  °[facility]  (888) 581-9988   °Julie Brannon, PhD [address] [place], [place]   (336) 349-5553 or (336) 951-0000   °[facility]   [address] °[place], [place] (336) 349-4454   °[facility] [address], [place], [place] (336) 342-8316  Insurance/Medicaid/sponsorship through [facility]  °[facility] [address]                                    [place], [place] (336) 342-8316 Therapy/tele-psych/case  °[facility] [address]  ° [place], [place] (336) 349-2233    °Dr. Arfeen  (336) 349-4544   °[facility] [place]  [employer] [place] [facility] 1) [address],  °2) [address], [place] °3)  [address], [place] (336) 349-3220 °(336) 342-7768 ° °(336) 342-8140   °[place] [facility] (336) 342-1394 or (336) 342-3537 (After Hours)    ° ° ° ° °

## 2014-12-30 NOTE — ED Notes (Signed)
Declined W/C at D/C and was escorted to lobby by RN. 
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
## 2015-03-03 ENCOUNTER — Encounter (HOSPITAL_COMMUNITY): Payer: Self-pay | Admitting: *Deleted

## 2015-03-03 ENCOUNTER — Emergency Department (HOSPITAL_COMMUNITY)
Admission: EM | Admit: 2015-03-03 | Discharge: 2015-03-03 | Disposition: A | Discharge status: Home / Self Care | Payer: BC Managed Care – PPO | Attending: Emergency Medicine | Admitting: Emergency Medicine

## 2015-03-03 ENCOUNTER — Emergency Department (HOSPITAL_COMMUNITY): Payer: BC Managed Care – PPO

## 2015-03-03 DIAGNOSIS — X500XXA Overexertion from strenuous movement or load, initial encounter: Secondary | ICD-10-CM | POA: Insufficient documentation

## 2015-03-03 DIAGNOSIS — F17210 Nicotine dependence, cigarettes, uncomplicated: Secondary | ICD-10-CM | POA: Diagnosis not present

## 2015-03-03 DIAGNOSIS — Z792 Long term (current) use of antibiotics: Secondary | ICD-10-CM | POA: Diagnosis not present

## 2015-03-03 DIAGNOSIS — I10 Essential (primary) hypertension: Secondary | ICD-10-CM | POA: Insufficient documentation

## 2015-03-03 DIAGNOSIS — Y998 Other external cause status: Secondary | ICD-10-CM | POA: Insufficient documentation

## 2015-03-03 DIAGNOSIS — Z79899 Other long term (current) drug therapy: Secondary | ICD-10-CM | POA: Insufficient documentation

## 2015-03-03 DIAGNOSIS — S4991XA Unspecified injury of right shoulder and upper arm, initial encounter: Secondary | ICD-10-CM | POA: Insufficient documentation

## 2015-03-03 DIAGNOSIS — Y9389 Activity, other specified: Secondary | ICD-10-CM | POA: Diagnosis not present

## 2015-03-03 DIAGNOSIS — Y9289 Other specified places as the place of occurrence of the external cause: Secondary | ICD-10-CM | POA: Diagnosis not present

## 2015-03-03 MED ORDER — IBUPROFEN 800 MG PO TABS: 800.0000 mg | ORAL_TABLET | Freq: Three times a day (TID) | ORAL | Status: DC

## 2015-03-03 MED ORDER — TRAMADOL HCL 50 MG PO TABS: 50.0000 mg | ORAL_TABLET | Freq: Two times a day (BID) | ORAL | Status: DC | PRN

## 2015-03-03 MED ORDER — KETOROLAC TROMETHAMINE 60 MG/2ML IM SOLN
60.0000 mg | Freq: Once | INTRAMUSCULAR | Status: AC
  Administered 2015-03-03: 60 mg via INTRAMUSCULAR
  Filled 2015-03-03: qty 2

## 2015-03-03 MED ORDER — CYCLOBENZAPRINE HCL 10 MG PO TABS: 10.0000 mg | ORAL_TABLET | Freq: Three times a day (TID) | ORAL | Status: DC | PRN

## 2015-03-03 NOTE — Progress Notes (Signed)
Orthopedic Tech Progress Note Patient Details:  Glenn Martin [birth year] [id number]  Ortho Devices Type of Ortho Device: Shoulder immobilizer Ortho Device/Splint Interventions: Application   [name] 03/03/2015, 5:19 PM

## 2015-03-03 NOTE — Discharge Instructions (Signed)
How to Use a Shoulder Immobilizer A shoulder immobilizer is a device that you may have to wear after a shoulder injury or surgery. This device keeps your arm from moving. This prevents additional pain or injury. It also supports your arm next to your body as your shoulder heals. You may need to wear a shoulder immobilizer to treat a broken bone (fracture) in your shoulder. You may also need to wear one if you have an injury that moves your shoulder out of position (dislocation). There are different types of shoulder immobilizers. The one that you get depends on your injury. RISKS AND COMPLICATIONS Wearing a shoulder immobilizer in the wrong way can let your injured shoulder move around too much. This may delay healing and make your pain and swelling worse. HOW TO USE YOUR SHOULDER IMMOBILIZER  The part of the immobilizer that goes around your neck (sling) should support your upper arm, with your elbow bent and your lower arm and hand across your chest.  Make sure that your elbow:  Is snug against the back pocket of the sling.  Does not move away from your body.  The strap of the immobilizer should go over your shoulder and support your arm and hand. Your hand should be slightly higher than your elbow. It should not hang loosely over the edge of the sling.  If the long strap has a pad, place it where it is most comfortable on your neck.  Carefully follow your health care provider's instructions for wearing your shoulder immobilizer. Your health care provider may want you to:  Loosen your immobilizer to straighten your elbow and move your wrist and fingers. You may have to do this several times each day. Ask your health care provider when you should do this and how often.  Remove your immobilizer once every day to shower, but limit the movement in your injured arm. Before putting the immobilizer back on, use a towel to dry the area under your arm completely.  Remove your immobilizer to do  shoulder exercises at home as directed by your health care provider.  Wear your immobilizer while you sleep. You may sleep more comfortably if you have your upper body raised on pillows. SEEK MEDICAL CARE IF:  Your immobilizer is not supporting your arm properly.  Your immobilizer gets damaged.  You have worsening pain or swelling in your shoulder, arm, or hand.  Your shoulder, arm, or hand changes color or temperature.  You lose feeling in your shoulder, arm, or hand.   This information is not intended to replace advice given to you by your health care provider. Make sure you discuss any questions you have with your health care provider.   Document Released: 01/30/2004 Document Revised: 05/08/2014 Document Reviewed: 11/29/2013 Elsevier Interactive Patient Education 2016 [employer] Shoulder Sprain A shoulder sprain is a partial or complete tear in one of the tough, fiber-like tissues (ligaments) in the shoulder. The ligaments in the shoulder help to hold the shoulder in place. CAUSES This condition may be caused by:  A fall.  A hit to the shoulder.  A twist of the arm. RISK FACTORS This condition is more likely to develop in:  People who play sports.  People who have problems with balance or coordination. SYMPTOMS Symptoms of this condition include:  Pain when moving the shoulder.  Limited ability to move the shoulder.  Swelling and tenderness on top of the shoulder.  Warmth in the shoulder.  A change in the shape of  the shoulder.  Redness or bruising on the shoulder. DIAGNOSIS This condition is diagnosed with a physical exam. During the exam, you may be asked to do simple exercises with your shoulder. You may also have imaging tests, such as X-rays, MRI, or a CT scan. These tests can show how severe the sprain is. TREATMENT This condition may be treated with:  Rest.  Pain medicine.  Ice.  A sling or brace. This is used to keep the arm still while the  shoulder is healing.  Physical therapy or rehabilitation exercises. These help to improve the range of motion and strength of the shoulder.  Surgery (rare). Surgery may be needed if the sprain caused a joint to become unstable. Surgery may also be needed to reduce pain. Some people may develop ongoing shoulder pain or lose some range of motion in the shoulder. However, most people do not develop long-term problems. HOME CARE INSTRUCTIONS  Rest.  Take over-the-counter and prescription medicines only as told by your health care provider.  If directed, apply ice to the area:  Put ice in a plastic bag.  Place a towel between your skin and the bag.  Leave the ice on for 20 minutes, 2-3 times per day.  If you were given a shoulder sling or brace:  Wear it as told.  Remove it to shower or bathe.  Move your arm only as much as told by your health care provider, but keep your hand moving to prevent swelling.  If you were shown how to do any exercises, do them as told by your health care provider.  Keep all follow-up visits as told by your health care provider. This is important. SEEK MEDICAL CARE IF:  Your pain gets worse.  Your pain is not relieved with medicines.  You have increased redness or swelling. SEEK IMMEDIATE MEDICAL CARE IF:  You have a fever.  You cannot move your arm or shoulder.  You develop numbness or tingling in your arms, hands, or fingers.   This information is not intended to replace advice given to you by your health care provider. Make sure you discuss any questions you have with your health care provider.   Document Released: 05/10/2008 Document Revised: 09/12/2014 Document Reviewed: 04/16/2014 Elsevier Interactive Patient Education [employer]

## 2015-03-03 NOTE — ED Notes (Signed)
Pt reports he was bench pressing on SAt and now his Rt shoulder hurts

## 2015-03-03 NOTE — ED Notes (Signed)
Declined W/C at D/C and was escorted to lobby by RN. 

## 2015-03-03 NOTE — ED Provider Notes (Signed)
CSN: [id number]     Arrival date & time 03/03/15  1518 History  By signing my name below, I, Evon Slack, attest that this documentation has been prepared under the direction and in the presence of Danelle Berry, PA-C. Electronically Signed: Evon Slack, ED Scribe. 03/03/2015. 5:10 PM.    Chief Complaint  Patient presents with  . Shoulder Injury   The history is provided by the patient. No language interpreter was used.   HPI Comments: Glenn Martin is a 35 y.o. male who presents to the Emergency Department complaining of new right shoulder pain s/p injury yesterday while lifing weights when his shoulder "gave out on him". Pt rates the severity of his pain 8/10 and throbbing, with radiation  down his right right arm, pain exacerbated by any movement, causing sharp stabbing 10/10 pain.   He states that initially after the injury he was able to move the arm. He states now that he is only able to move the arm with assistance from the left hand and he is most comfortable with the arm resting on his abdomen. Pt states that he has tried ibuprofen with no relief. Pt denies numbness, tingling, swelling, redness.  No other injuries or acute complaints.  NKDA.  Past Medical History  Diagnosis Date  . Hypertension    Past Surgical History  Procedure Laterality Date  . Hip surgery Bilateral    History reviewed. No pertinent family history. Social History  Substance Use Topics  . Smoking status: Current Every Day Smoker -- 1.00 packs/day    Types: Cigarettes  . Smokeless tobacco: Never Used  . Alcohol Use: Yes     Comment: occasionally     Review of Systems  Musculoskeletal: Positive for arthralgias.  Neurological: Negative for numbness.  All other systems reviewed and are negative.     Allergies  Review of patient's allergies indicates no known allergies.  Home Medications   Prior to Admission medications   Medication Sig Start Date End Date Taking? Authorizing Provider   amoxicillin (AMOXIL) 500 MG capsule Take 1 capsule (500 mg total) by mouth 3 (three) times daily. 10/05/14   Garlon Hatchet, PA-C  cyclobenzaprine (FLEXERIL) 10 MG tablet Take 1 tablet (10 mg total) by mouth 3 (three) times daily as needed for muscle spasms. 03/03/15   Danelle Berry, PA-C  docusate sodium (COLACE) 100 MG capsule Take 1 capsule (100 mg total) by mouth 2 (two) times daily as needed for mild constipation or moderate constipation. 06/01/14   Trixie Dredge, PA-C  hydrocortisone (ANUSOL-HC) 25 MG suppository Place 1 suppository (25 mg total) rectally 2 (two) times daily. For 7 days 06/01/14   Trixie Dredge, PA-C  ibuprofen (ADVIL,MOTRIN) 800 MG tablet Take 1 tablet (800 mg total) by mouth 3 (three) times daily. 03/03/15   Danelle Berry, PA-C  lisinopril-hydrochlorothiazide (PRINZIDE,ZESTORETIC) 20-25 MG tablet Take 1 tablet by mouth daily. 10/05/14   Garlon Hatchet, PA-C  methocarbamol (ROBAXIN) 500 MG tablet Take 1 tablet (500 mg total) by mouth 2 (two) times daily. 12/30/14   Hannah Muthersbaugh, PA-C  traMADol (ULTRAM) 50 MG tablet Take 1 tablet (50 mg total) by mouth every 12 (twelve) hours as needed for severe pain. 03/03/15   Danelle Berry, PA-C   BP 145/90 mmHg  Pulse 95  Temp(Src) 97.5 F (36.4 C) (Oral)  Resp 18  Ht  (1.803 m)  Wt 124.739 kg  BMI 38.37 kg/m2  SpO2 96%   Physical Exam  Constitutional: He is oriented to person,  place, and time. He appears well-developed and well-nourished. No distress.  HENT:  Head: Normocephalic and atraumatic.  Eyes: Conjunctivae and EOM are normal.  Neck: Neck supple. No tracheal deviation present.  Cardiovascular: Normal rate.   Pulses:      Radial pulses are 2+ on the right side, and 2+ on the left side.  Pulmonary/Chest: Effort normal. No respiratory distress.  Musculoskeletal: He exhibits tenderness. He exhibits no edema.       Right shoulder: He exhibits decreased range of motion, tenderness and pain. He exhibits no bony tenderness, no  swelling, no effusion, no crepitus, no deformity, no laceration, no spasm and normal pulse.       Right elbow: Normal.He exhibits normal range of motion, no swelling, no effusion, no deformity and no laceration. No tenderness found.       Right wrist: Normal. He exhibits normal range of motion and no tenderness.  Right shoulder: Normal to inspection w/o deformity, no erythema.  no bony tenderness to [facility] joint, glenohumeral fossa, clavical, scapular spine.  No sulcus sign palpated.  Pain with palpation to bicepital groove with internal and external rotation of right elbow, limited ROM of R shoulder, Flexion to 90 degress and abduction to <90 degrees Normal sensation to light touch throughout right arm Normal grip strength bilaterally Exam limited by body habitus and by pain.  Neurological: He is alert and oriented to person, place, and time.  Skin: Skin is warm and dry.  Psychiatric: He has a normal mood and affect. His behavior is normal.  Nursing note and vitals reviewed.   ED Course  Procedures (including critical care time) DIAGNOSTIC STUDIES: Oxygen Saturation is 96% on RA, normal by my interpretation.    COORDINATION OF CARE: 4:20 PM-Discussed treatment plan with pt at bedside and pt agreed to plan.     Labs Review Labs Reviewed - No data to display  Imaging Review Dg Shoulder Right  03/03/2015  CLINICAL DATA:  Shoulder pain following lifting weights yesterday, initial encounter EXAM: RIGHT SHOULDER - 2+ VIEW COMPARISON:  None. FINDINGS: There is no evidence of fracture or dislocation. There is no evidence of arthropathy or other focal bone abnormality. Soft tissues are unremarkable. IMPRESSION: No acute abnormality noted. Electronically Signed   By: Alcide Clever M.D.   On: 03/03/2015 16:47      EKG Interpretation None      MDM   Pt with right shoulder injury that occurred yesterday. Xray negative for fx or dislocation No concern for infection Pt has severely limited ROM  and gradually progressing pain, likely tendonopathy, may have rotator cuff injury.  Pt placed in sling, given pain meds, NSAIDs, reviewed RICE tx, encouraged to f/up with ortho.  D/C in good condition, normal and stable VS.  Final diagnoses:  Right shoulder injury, initial encounter    I personally performed the services described in this documentation, which was scribed in my presence. The recorded information has been reviewed and is accurate.      Danelle Berry, PA-C 03/03/15 1710  Doug Sou, MD 03/03/15 2326

## 2015-03-11 ENCOUNTER — Other Ambulatory Visit: Payer: Self-pay | Admitting: Sports Medicine

## 2015-03-11 DIAGNOSIS — M25511 Pain in right shoulder: Secondary | ICD-10-CM

## 2015-03-20 ENCOUNTER — Ambulatory Visit
Admission: RE | Admit: 2015-03-20 | Discharge: 2015-03-20 | Disposition: A | Discharge status: Home / Self Care | Payer: BC Managed Care – PPO | Source: Ambulatory Visit | Attending: Sports Medicine | Admitting: Sports Medicine

## 2015-03-20 DIAGNOSIS — M25511 Pain in right shoulder: Secondary | ICD-10-CM

## 2015-06-05 ENCOUNTER — Emergency Department (HOSPITAL_COMMUNITY)
Admission: EM | Admit: 2015-06-05 | Discharge: 2015-06-05 | Disposition: A | Discharge status: Home / Self Care | Payer: BC Managed Care – PPO | Attending: Emergency Medicine | Admitting: Emergency Medicine

## 2015-06-05 ENCOUNTER — Encounter (HOSPITAL_COMMUNITY): Payer: Self-pay | Admitting: *Deleted

## 2015-06-05 DIAGNOSIS — Z87891 Personal history of nicotine dependence: Secondary | ICD-10-CM | POA: Insufficient documentation

## 2015-06-05 DIAGNOSIS — I159 Secondary hypertension, unspecified: Secondary | ICD-10-CM | POA: Diagnosis not present

## 2015-06-05 DIAGNOSIS — I10 Essential (primary) hypertension: Secondary | ICD-10-CM | POA: Diagnosis present

## 2015-06-05 DIAGNOSIS — Z79899 Other long term (current) drug therapy: Secondary | ICD-10-CM | POA: Insufficient documentation

## 2015-06-05 MED ORDER — BUTALBITAL-APAP-CAFFEINE 50-325-40 MG PO TABS
1.0000 | ORAL_TABLET | Freq: Once | ORAL | Status: AC
  Administered 2015-06-05: 1 via ORAL
  Filled 2015-06-05: qty 1

## 2015-06-05 MED ORDER — HYDROCHLOROTHIAZIDE 12.5 MG PO CAPS
25.0000 mg | ORAL_CAPSULE | Freq: Once | ORAL | Status: AC
  Administered 2015-06-05: 25 mg via ORAL
  Filled 2015-06-05: qty 2

## 2015-06-05 MED ORDER — LISINOPRIL-HYDROCHLOROTHIAZIDE 20-25 MG PO TABS: 1.0000 | ORAL_TABLET | Freq: Every day | ORAL | Status: DC

## 2015-06-05 MED ORDER — LISINOPRIL 20 MG PO TABS
20.0000 mg | ORAL_TABLET | Freq: Once | ORAL | Status: AC
  Administered 2015-06-05: 20 mg via ORAL
  Filled 2015-06-05: qty 1

## 2015-06-05 NOTE — ED Provider Notes (Signed)
CSN: [id number]     Arrival date & time 06/05/15  0617 History   First MD Initiated Contact with Patient 06/05/15 [phone]     Chief Complaint  Patient presents with  . Hypertension     (Consider location/radiation/quality/duration/timing/severity/associated sxs/prior Treatment) Patient is a 35 y.o. male presenting with hypertension. The history is provided by the patient.  Hypertension This is a new problem. The current episode started less than 1 hour ago. The problem occurs constantly. The problem has not changed since onset.Associated symptoms include headaches. Pertinent negatives include no chest pain, no abdominal pain and no shortness of breath. Nothing aggravates the symptoms. Nothing relieves the symptoms. He has tried nothing for the symptoms. The treatment provided no relief.   35 yo M with a cc of HTN.  Going on for past couple days.  Patient has run out of his medication, took his moms with minimal relief.  Mild headache, has some mild tingling to LUE.  Diffuse not in a nerve distribution.    Past Medical History  Diagnosis Date  . Hypertension    Past Surgical History  Procedure Laterality Date  . Hip surgery Bilateral    No family history on file. Social History  Substance Use Topics  . Smoking status: Former Smoker -- 1.00 packs/day    Types: Cigarettes    Quit date: 05/29/2015  . Smokeless tobacco: Never Used  . Alcohol Use: Yes     Comment: occasionally     Review of Systems  Constitutional: Negative for fever and chills.  HENT: Negative for congestion and facial swelling.   Eyes: Negative for discharge and visual disturbance.  Respiratory: Negative for shortness of breath.   Cardiovascular: Negative for chest pain and palpitations.  Gastrointestinal: Negative for vomiting, abdominal pain and diarrhea.  Musculoskeletal: Negative for myalgias and arthralgias.  Skin: Negative for color change and rash.  Neurological: Positive for numbness and headaches.  Negative for tremors and syncope.  Psychiatric/Behavioral: Negative for confusion and dysphoric mood.      Allergies  Review of patient's allergies indicates no known allergies.  Home Medications   Prior to Admission medications   Medication Sig Start Date End Date Taking? Authorizing Provider  amoxicillin (AMOXIL) 500 MG capsule Take 1 capsule (500 mg total) by mouth 3 (three) times daily. 10/05/14   Garlon Hatchet, PA-C  cyclobenzaprine (FLEXERIL) 10 MG tablet Take 1 tablet (10 mg total) by mouth 3 (three) times daily as needed for muscle spasms. 03/03/15   Danelle Berry, PA-C  docusate sodium (COLACE) 100 MG capsule Take 1 capsule (100 mg total) by mouth 2 (two) times daily as needed for mild constipation or moderate constipation. 06/01/14   Trixie Dredge, PA-C  hydrocortisone (ANUSOL-HC) 25 MG suppository Place 1 suppository (25 mg total) rectally 2 (two) times daily. For 7 days 06/01/14   Trixie Dredge, PA-C  ibuprofen (ADVIL,MOTRIN) 800 MG tablet Take 1 tablet (800 mg total) by mouth 3 (three) times daily. 03/03/15   Danelle Berry, PA-C  lisinopril-hydrochlorothiazide (PRINZIDE,ZESTORETIC) 20-25 MG tablet Take 1 tablet by mouth daily. 06/05/15   Melene Plan, DO  methocarbamol (ROBAXIN) 500 MG tablet Take 1 tablet (500 mg total) by mouth 2 (two) times daily. 12/30/14   Hannah Muthersbaugh, PA-C  traMADol (ULTRAM) 50 MG tablet Take 1 tablet (50 mg total) by mouth every 12 (twelve) hours as needed for severe pain. 03/03/15   Danelle Berry, PA-C   BP 161/110 mmHg  Pulse 77  Temp(Src) 98.3 F (36.8 C) (Oral)  Resp 22  Ht 5\' 11"  (1.803 m)  Wt 281 lb (127.461 kg)  BMI 39.21 kg/m2  SpO2 98% Physical Exam  Constitutional: He is oriented to person, place, and time. He appears well-developed and well-nourished.  HENT:  Head: Normocephalic and atraumatic.  Eyes: EOM are normal. Pupils are equal, round, and reactive to light.  Neck: Normal range of motion. Neck supple. No JVD present.  Cardiovascular:  Normal rate and regular rhythm.  Exam reveals no gallop and no friction rub.   No murmur heard. Pulmonary/Chest: No respiratory distress. He has no wheezes.  Abdominal: He exhibits no distension. There is no tenderness. There is no rebound and no guarding.  Musculoskeletal: Normal range of motion.  Neurological: He is alert and oriented to person, place, and time. He has normal strength. No cranial nerve deficit or sensory deficit. He displays a negative Romberg sign. Coordination and gait normal. GCS eye subscore is 4. GCS verbal subscore is 5. GCS motor subscore is 6. He displays no Babinski's sign on the right side. He displays no Babinski's sign on the left side.  Reflex Scores:      Tricep reflexes are 2+ on the right side and 2+ on the left side.      Bicep reflexes are 2+ on the right side and 2+ on the left side.      Brachioradialis reflexes are 2+ on the right side and 2+ on the left side.      Patellar reflexes are 2+ on the right side and 2+ on the left side.      Achilles reflexes are 2+ on the right side and 2+ on the left side. Benign neuro exam. Somewhat limited due to left shoulder pain  Skin: No rash noted. No pallor.  Psychiatric: He has a normal mood and affect. His behavior is normal.  Nursing note and vitals reviewed.   ED Course  Procedures (including critical care time) Labs Review Labs Reviewed - No data to display  Imaging Review No results found. I have personally reviewed and evaluated these images and lab results as part of my medical decision-making.   EKG Interpretation None      MDM   Final diagnoses:  Secondary hypertension, unspecified    35 yo M with HTN does not have PCP, has been without his home meds.  Benign neuro exam. Headache with no red flags.  Given script for his med, given PCP hotline number.   7:40 AM:  I have discussed the diagnosis/risks/treatment options with the patient and believe the pt to be eligible for discharge home to  follow-up with PCP. We also discussed returning to the ED immediately if new or worsening sx occur. We discussed the sx which are most concerning (e.g., weakness, other neuro symptoms, sudden worsening headache, fever) that necessitate immediate return. Medications administered to the patient during their visit and any new prescriptions provided to the patient are listed below.  Medications given during this visit Medications  butalbital-acetaminophen-caffeine (FIORICET, ESGIC) 50-325-40 MG per tablet 1 tablet (not administered)  lisinopril (PRINIVIL,ZESTRIL) tablet 20 mg (not administered)  hydrochlorothiazide (MICROZIDE) capsule 25 mg (not administered)    New Prescriptions   No medications on file    The patient appears reasonably screen and/or stabilized for discharge and I doubt any other medical condition or other [facility] requiring further screening, evaluation, or treatment in the ED at this time prior to discharge.      Melene Planan Yacqub Baston, DO 06/05/15 [phone]

## 2015-06-05 NOTE — Discharge Instructions (Signed)
Call the number hotline to see if there is a PCP that will work for you.  Take your medication as prescribed.  Hypertension Hypertension is another name for high blood pressure. High blood pressure forces your heart to work harder to pump blood. A blood pressure reading has two numbers, which includes a higher number over a lower number (example: 110/72). HOME CARE   Have your blood pressure rechecked by your doctor.  Only take medicine as told by your doctor. Follow the directions carefully. The medicine does not work as well if you skip doses. Skipping doses also puts you at risk for problems.  Do not smoke.  Monitor your blood pressure at home as told by your doctor. GET HELP IF:  You think you are having a reaction to the medicine you are taking.  You have repeat headaches or feel dizzy.  You have puffiness (swelling) in your ankles.  You have trouble with your vision. GET HELP RIGHT AWAY IF:   You get a very bad headache and are confused.  You feel weak, numb, or faint.  You get chest or belly (abdominal) pain.  You throw up (vomit).  You cannot breathe very well. MAKE SURE YOU:   Understand these instructions.  Will watch your condition.  Will get help right away if you are not doing well or get worse.   This information is not intended to replace advice given to you by your health care provider. Make sure you discuss any questions you have with your health care provider.   Document Released: 06/10/2007 Document Revised: 12/27/2012 Document Reviewed: 10/14/2012 Elsevier Interactive Patient Education [employer]2016 [employer]

## 2015-06-05 NOTE — ED Notes (Signed)
Pt states that he has been hypertensive since Sat; pt states that his BP has been elvated since Sat despite taking his mediations as prescribed; pt states that he took a dose of his mothers medication and still has  HTN; pt c/o headache

## 2015-10-11 ENCOUNTER — Emergency Department (HOSPITAL_BASED_OUTPATIENT_CLINIC_OR_DEPARTMENT_OTHER)
Admission: EM | Admit: 2015-10-11 | Discharge: 2015-10-11 | Disposition: A | Discharge status: Home / Self Care | Payer: Medicaid Other | Attending: Emergency Medicine | Admitting: Emergency Medicine

## 2015-10-11 ENCOUNTER — Encounter (HOSPITAL_BASED_OUTPATIENT_CLINIC_OR_DEPARTMENT_OTHER): Payer: Self-pay | Admitting: *Deleted

## 2015-10-11 DIAGNOSIS — R42 Dizziness and giddiness: Secondary | ICD-10-CM | POA: Insufficient documentation

## 2015-10-11 DIAGNOSIS — Z87891 Personal history of nicotine dependence: Secondary | ICD-10-CM | POA: Diagnosis not present

## 2015-10-11 DIAGNOSIS — R51 Headache: Secondary | ICD-10-CM | POA: Diagnosis not present

## 2015-10-11 DIAGNOSIS — Z9114 Patient's other noncompliance with medication regimen: Secondary | ICD-10-CM | POA: Diagnosis not present

## 2015-10-11 DIAGNOSIS — Z79899 Other long term (current) drug therapy: Secondary | ICD-10-CM | POA: Insufficient documentation

## 2015-10-11 DIAGNOSIS — Z791 Long term (current) use of non-steroidal anti-inflammatories (NSAID): Secondary | ICD-10-CM | POA: Diagnosis not present

## 2015-10-11 DIAGNOSIS — I10 Essential (primary) hypertension: Secondary | ICD-10-CM | POA: Insufficient documentation

## 2015-10-11 LAB — CBC WITH DIFFERENTIAL/PLATELET
BASOS PCT: 0 %
Basophils Absolute: 0 10*3/uL (ref 0.0–0.1)
Eosinophils Absolute: 0.1 10*3/uL (ref 0.0–0.7)
Eosinophils Relative: 2 %
HEMATOCRIT: 41.6 % (ref 39.0–52.0)
HEMOGLOBIN: 13.8 g/dL (ref 13.0–17.0)
Lymphocytes Relative: 26 %
Lymphs Abs: 1.5 10*3/uL (ref 0.7–4.0)
MCH: 27 pg (ref 26.0–34.0)
MCHC: 33.2 g/dL (ref 30.0–36.0)
MCV: 81.4 fL (ref 78.0–100.0)
MONOS PCT: 10 %
Monocytes Absolute: 0.6 10*3/uL (ref 0.1–1.0)
NEUTROS ABS: 3.5 10*3/uL (ref 1.7–7.7)
NEUTROS PCT: 62 %
Platelets: 242 10*3/uL (ref 150–400)
RBC: 5.11 MIL/uL (ref 4.22–5.81)
RDW: 14.1 % (ref 11.5–15.5)
WBC: 5.7 10*3/uL (ref 4.0–10.5)

## 2015-10-11 LAB — BASIC METABOLIC PANEL
ANION GAP: 7 (ref 5–15)
BUN: 13 mg/dL (ref 6–20)
CALCIUM: 9.1 mg/dL (ref 8.9–10.3)
CHLORIDE: 105 mmol/L (ref 101–111)
CO2: 25 mmol/L (ref 22–32)
Creatinine, Ser: 1.14 mg/dL (ref 0.61–1.24)
GFR calc non Af Amer: >60 mL/min (ref >60)
Glucose, Bld: 87 mg/dL (ref 65–99)
POTASSIUM: 3.8 mmol/L (ref 3.5–5.1)
Sodium: 137 mmol/L (ref 135–145)

## 2015-10-11 LAB — TROPONIN I: Troponin I: <0.03 ng/mL (ref <0.03)

## 2015-10-11 MED ORDER — LISINOPRIL-HYDROCHLOROTHIAZIDE 20-25 MG PO TABS: 1.0000 | ORAL_TABLET | Freq: Every day | ORAL | 0 refills | Status: DC

## 2015-10-11 MED ORDER — HYDROCHLOROTHIAZIDE 25 MG PO TABS
25.0000 mg | ORAL_TABLET | Freq: Once | ORAL | Status: AC
  Administered 2015-10-11: 25 mg via ORAL
  Filled 2015-10-11: qty 1

## 2015-10-11 MED ORDER — ACETAMINOPHEN 500 MG PO TABS
1000.0000 mg | ORAL_TABLET | Freq: Once | ORAL | Status: AC
  Administered 2015-10-11: 1000 mg via ORAL
  Filled 2015-10-11: qty 2

## 2015-10-11 MED ORDER — LISINOPRIL 10 MG PO TABS
20.0000 mg | ORAL_TABLET | Freq: Once | ORAL | Status: AC
  Administered 2015-10-11: 20 mg via ORAL
  Filled 2015-10-11: qty 2

## 2015-10-11 NOTE — ED Notes (Signed)
Pt verbalizes understanding of d/c instructions and denies any further needs at this time. 

## 2015-10-11 NOTE — Discharge Instructions (Signed)
Keep your scheduled appointment with your primary care physician next week. Ask your physician to recheck her blood pressure. Today's was mildly elevated at 158/78 prior to giving blood pressure medication here. Ask your doctorto help you to stop smoking

## 2015-10-11 NOTE — ED Triage Notes (Signed)
Today he been lightheaded and dizzy. He ran out of BP medication 2 days ago.

## 2015-10-11 NOTE — ED Provider Notes (Addendum)
[facility] Provider Note   CSN: [id number] Arrival date & time: 10/11/15  1724   By signing my name below, I, Glenn Martin and Glenn Martin, attest that this documentation has been prepared under the direction and in the presence of Doug Sou, MD. Electronically signed, Glenn Martin and Glenn Martin, ED Scribe. 10/11/15. 7:47 PM.   History   Chief Complaint Chief Complaint  Patient presents with  . Dizziness  The history is provided by the patient and the spouse. No language interpreter was used.   HPI Comments: Glenn Martin is a 35 y.o. male with h/o HTN on 20/25 mg Lisonpril/HCTZ who presents to the Emergency Department complaining of moderatDizziness meaning lightheadednesse gradually improving dizziness starting at 7:30 AM today. Pt states his symptoms began while driving and describes his dizziness as lightheadedness. He denies a "room spinning" sensation. Pt states his symptoms are worsened with ambulation and with positional changes. He reports his dizziness has improved since onset; however, he still feels mildly dizzy. He also complains of mild occipital headache, gradual onset.  Pt states he ran out of Lisinopril/HCTZ 2 days ago. He is being seen at [facility] and notes that he has an appointment in 6 days. He denies PMhx or FMhx cardiac disease. Pt states his BMs have been normal. NKDA. Pt is a current daily smoker and an occasional drinker. No treatment prior to coming here nothing makes symptoms better or worse. Symptoms improving steadily with time. No chest pain no shortness of breath no abdominal pain  Past Medical History:  Diagnosis Date  . Hypertension     There are no active problems to display for this patient.   Past Surgical History:  Procedure Laterality Date  . HIP SURGERY Bilateral   . SHOULDER SURGERY         Home Medications    Prior to Admission medications   Medication Sig Start Date End Date Taking? Authorizing Provider    ibuprofen (ADVIL,MOTRIN) 800 MG tablet Take 1 tablet (800 mg total) by mouth 3 (three) times daily. 03/03/15  Yes Danelle Berry, PA-C  amoxicillin (AMOXIL) 500 MG capsule Take 1 capsule (500 mg total) by mouth 3 (three) times daily. 10/05/14   Garlon Hatchet, PA-C  cyclobenzaprine (FLEXERIL) 10 MG tablet Take 1 tablet (10 mg total) by mouth 3 (three) times daily as needed for muscle spasms. 03/03/15   Danelle Berry, PA-C  docusate sodium (COLACE) 100 MG capsule Take 1 capsule (100 mg total) by mouth 2 (two) times daily as needed for mild constipation or moderate constipation. 06/01/14   Trixie Dredge, PA-C  hydrocortisone (ANUSOL-HC) 25 MG suppository Place 1 suppository (25 mg total) rectally 2 (two) times daily. For 7 days 06/01/14   Trixie Dredge, PA-C  lisinopril-hydrochlorothiazide (PRINZIDE,ZESTORETIC) 20-25 MG tablet Take 1 tablet by mouth daily. 06/05/15   Melene Plan, DO  methocarbamol (ROBAXIN) 500 MG tablet Take 1 tablet (500 mg total) by mouth 2 (two) times daily. 12/30/14   Hannah Muthersbaugh, PA-C  traMADol (ULTRAM) 50 MG tablet Take 1 tablet (50 mg total) by mouth every 12 (twelve) hours as needed for severe pain. 03/03/15   Danelle Berry, PA-C    Family History No family history on file.  Social History Social History  Substance Use Topics  . Smoking status: Former Smoker    Packs/day: 1.00    Types: Cigarettes    Quit date: 05/29/2015  . Smokeless tobacco: Never Used  . Alcohol use Yes     Comment:  occasionally    Current smoker  Allergies   Review of patient's allergies indicates no known allergies.   Review of Systems Review of Systems  Constitutional: Negative.   Respiratory: Negative.  Negative for shortness of breath.   Cardiovascular: Negative.  Negative for chest pain.  Gastrointestinal: Negative.   Musculoskeletal: Negative.   Skin: Negative.   Neurological: Positive for dizziness, light-headedness and headaches.  Psychiatric/Behavioral: Negative.   All other systems  reviewed and are negative.    Physical Exam Updated Vital Signs BP 158/78 (BP Location: Left Arm) Comment: prescribed BP medication, but has been out for two days.  Pulse 78   Temp 98.2 F (36.8 C) (Oral)   Resp 20   Ht 5\' 11"  (1.803 m)   Wt 287 lb (130.2 kg)   SpO2 97%   BMI 40.03 kg/m   Physical Exam  Constitutional: He appears well-developed and well-nourished.  HENT:  Head: Normocephalic and atraumatic.  Eyes: Conjunctivae are normal. Pupils are equal, round, and reactive to light.  Neck: Neck supple. No tracheal deviation present. No thyromegaly present.  Cardiovascular: Normal rate and regular rhythm.   No murmur heard. Pulmonary/Chest: Effort normal and breath sounds normal.  Abdominal: Soft. Bowel sounds are normal. He exhibits no distension. There is no tenderness.  Musculoskeletal: Normal range of motion. He exhibits no edema or tenderness.  Neurological: He is alert. Coordination normal.  Skin: Skin is warm and dry. No rash noted.  Psychiatric: He has a normal mood and affect.  Nursing note and vitals reviewed.    ED Treatments / Results  DIAGNOSTIC STUDIES: Oxygen Saturation is 97% on RA, adequate by my interpretation.    COORDINATION OF CARE: 7:37 PM Will order tylenol, lab work. Discussed treatment plan with pt at bedside and pt agreed to plan.    Labs (all labs ordered are listed, but only abnormal results are displayed) Labs Reviewed - No data to display  EKG  EKG Interpretation  Date/Time:  Friday October 11 2015 20:36:33 EDT Ventricular Rate:  65 PR Interval:    QRS Duration: 104 QT Interval:  377 QTC Calculation: 392 R Axis:   98 Text Interpretation:  Sinus rhythm Ventricular premature complex Borderline right axis deviation Low voltage, precordial leads Nonspecific repol abnormality, inferior leads Confirmed by Ethelda ChickJACUBOWITZ  MD, Duncan Alejandro [phone]([id number]) on 10/11/2015 8:47:52 PM      Results for orders placed or performed during the hospital encounter  of 10/11/15  Basic metabolic panel  Result Value Ref Range   Sodium 137 135 - 145 mmol/L   Potassium 3.8 3.5 - 5.1 mmol/L   Chloride 105 101 - 111 mmol/L   CO2 25 22 - 32 mmol/L   Glucose, Bld 87 65 - 99 mg/dL   BUN 13 6 - 20 mg/dL   Creatinine, Ser 6.041.14 0.61 - 1.24 mg/dL   Calcium 9.1 8.9 - 54.010.3 mg/dL   GFR calc non Af Amer >60 >60 mL/min   GFR calc Af Amer >60 >60 mL/min   Anion gap 7 5 - 15  CBC with Differential/Platelet  Result Value Ref Range   WBC 5.7 4.0 - 10.5 K/uL   RBC 5.11 4.22 - 5.81 MIL/uL   Hemoglobin 13.8 13.0 - 17.0 g/dL   HCT 98.141.6 19.139.0 - 47.852.0 %   MCV 81.4 78.0 - 100.0 fL   MCH 27.0 26.0 - 34.0 pg   MCHC 33.2 30.0 - 36.0 g/dL   RDW 29.514.1 62.111.5 - 30.815.5 %   Platelets 242 150 -  400 K/uL   Neutrophils Relative % 62 %   Neutro Abs 3.5 1.7 - 7.7 K/uL   Lymphocytes Relative 26 %   Lymphs Abs 1.5 0.7 - 4.0 K/uL   Monocytes Relative 10 %   Monocytes Absolute 0.6 0.1 - 1.0 K/uL   Eosinophils Relative 2 %   Eosinophils Absolute 0.1 0.0 - 0.7 K/uL   Basophils Relative 0 %   Basophils Absolute 0.0 0.0 - 0.1 K/uL  Troponin I  Result Value Ref Range   Troponin I <0.03 <0.03 ng/mL   No results found.  Radiology No results found.  Procedures Procedures (including critical care time)  Medications Ordered in ED Medications - No data to display   Initial Impression / Assessment and Plan / ED Course  I have reviewed the triage vital signs and the nursing notes.  Pertinent labs & imaging results that were available during my care of the patient were reviewed by me and considered in my medical decision making (see chart for details).  Clinical Course    Dizziness continues to feel improved after treatment with HCTZ and lisinopril. Headache improved after treatment with Tylenol. Strongly doubt hypertensive emergency. Symptoms likely unrelated hypertensive Plan keep scheduled appointment with primary care physician next week. I counseled patient for 5 minutes on  smoking cessation. Prescription Prinzide Final Clinical Impressions(s) / ED Diagnoses  Diagnoses #1 dizziness #2 elevated blood pressure #3 medication noncompliance #4 tobacco abuse Final diagnoses:  None    New Prescriptions New Prescriptions   No medications on file       I personally performed the services described in this documentation, which was scribed in my presence. The recorded information has been reviewed and considered.    Doug Sou, MD 10/11/15 2055    Doug Sou, MD 10/11/15 2055

## 2015-10-31 ENCOUNTER — Emergency Department (HOSPITAL_COMMUNITY): Payer: Medicaid Other

## 2015-10-31 ENCOUNTER — Emergency Department (HOSPITAL_COMMUNITY)
Admission: EM | Admit: 2015-10-31 | Discharge: 2015-10-31 | Disposition: A | Discharge status: Home / Self Care | Payer: Medicaid Other | Attending: Emergency Medicine | Admitting: Emergency Medicine

## 2015-10-31 ENCOUNTER — Encounter (HOSPITAL_COMMUNITY): Payer: Self-pay | Admitting: Emergency Medicine

## 2015-10-31 DIAGNOSIS — R1011 Right upper quadrant pain: Secondary | ICD-10-CM

## 2015-10-31 DIAGNOSIS — Z79899 Other long term (current) drug therapy: Secondary | ICD-10-CM | POA: Diagnosis not present

## 2015-10-31 DIAGNOSIS — Z87891 Personal history of nicotine dependence: Secondary | ICD-10-CM | POA: Insufficient documentation

## 2015-10-31 DIAGNOSIS — I10 Essential (primary) hypertension: Secondary | ICD-10-CM | POA: Insufficient documentation

## 2015-10-31 LAB — COMPREHENSIVE METABOLIC PANEL
ALK PHOS: 58 U/L (ref 38–126)
ALT: 45 U/L (ref 17–63)
AST: 20 U/L (ref 15–41)
Albumin: 4 g/dL (ref 3.5–5.0)
Anion gap: 7 (ref 5–15)
BUN: 24 mg/dL — AB (ref 6–20)
CALCIUM: 8.8 mg/dL — AB (ref 8.9–10.3)
CHLORIDE: 102 mmol/L (ref 101–111)
CO2: 28 mmol/L (ref 22–32)
CREATININE: 1.5 mg/dL — AB (ref 0.61–1.24)
GFR, EST NON AFRICAN AMERICAN: 59 mL/min — AB (ref >60)
Glucose, Bld: 72 mg/dL (ref 65–99)
Potassium: 3.5 mmol/L (ref 3.5–5.1)
Sodium: 137 mmol/L (ref 135–145)
Total Bilirubin: 0.5 mg/dL (ref 0.3–1.2)
Total Protein: 7 g/dL (ref 6.5–8.1)

## 2015-10-31 LAB — CBC
HCT: 45.5 % (ref 39.0–52.0)
Hemoglobin: 15 g/dL (ref 13.0–17.0)
MCH: 27.1 pg (ref 26.0–34.0)
MCHC: 33 g/dL (ref 30.0–36.0)
MCV: 82.3 fL (ref 78.0–100.0)
PLATELETS: 286 10*3/uL (ref 150–400)
RBC: 5.53 MIL/uL (ref 4.22–5.81)
RDW: 13.6 % (ref 11.5–15.5)
WBC: 9.3 10*3/uL (ref 4.0–10.5)

## 2015-10-31 LAB — URINALYSIS, ROUTINE W REFLEX MICROSCOPIC
Bilirubin Urine: NEGATIVE
GLUCOSE, UA: NEGATIVE mg/dL
HGB URINE DIPSTICK: NEGATIVE
KETONES UR: NEGATIVE mg/dL
LEUKOCYTES UA: NEGATIVE
Nitrite: NEGATIVE
PROTEIN: NEGATIVE mg/dL
Specific Gravity, Urine: 1.023 (ref 1.005–1.030)
pH: 6 (ref 5.0–8.0)

## 2015-10-31 LAB — LIPASE, BLOOD: Lipase: 28 U/L (ref 11–51)

## 2015-10-31 MED ORDER — ONDANSETRON HCL 4 MG/2ML IJ SOLN
4.0000 mg | Freq: Once | INTRAMUSCULAR | Status: AC | PRN
  Administered 2015-10-31: 4 mg via INTRAVENOUS
  Filled 2015-10-31: qty 2

## 2015-10-31 MED ORDER — SODIUM CHLORIDE 0.9 % IV BOLUS (SEPSIS)
1000.0000 mL | Freq: Once | INTRAVENOUS | Status: AC
  Administered 2015-10-31: 1000 mL via INTRAVENOUS

## 2015-10-31 MED ORDER — PANTOPRAZOLE SODIUM 20 MG PO TBEC: 20.0000 mg | DELAYED_RELEASE_TABLET | Freq: Every day | ORAL | 0 refills | Status: DC

## 2015-10-31 MED ORDER — FAMOTIDINE IN NACL 20-0.9 MG/50ML-% IV SOLN
20.0000 mg | Freq: Once | INTRAVENOUS | Status: AC
  Administered 2015-10-31: 20 mg via INTRAVENOUS
  Filled 2015-10-31: qty 50

## 2015-10-31 MED ORDER — GI COCKTAIL ~~LOC~~
30.0000 mL | Freq: Once | ORAL | Status: AC
  Administered 2015-10-31: 30 mL via ORAL
  Filled 2015-10-31: qty 30

## 2015-10-31 NOTE — Discharge Instructions (Signed)
Keep your scheduled appointment with your primary care provider on November 8th. Take Protonix daily. Discontinue NSAID's (anti-inflammatory medication such as ibuprofen, naproxen, aleve). Avoid alcohol, spicy foods, caffeine as this can make upper abdominal pain worse.   Please seek immediate care if you develop any of the following symptoms: The pain does not go away.  You have a fever.  You keep throwing up (vomiting).  You pass bloody or black tarry stools.  There is bright red blood in the stool.  There is rectal pain.  You do not seem to be getting better.  You have any questions or concerns.

## 2015-10-31 NOTE — ED Provider Notes (Signed)
[facility]-EMERGENCY DEPT Provider Note   CSN: [id number] Arrival date & time: 10/31/15  0919     History   Chief Complaint Chief Complaint  Patient presents with  . Abdominal Pain  . Back Pain    HPI Glenn Martin is a 35 y.o. male.  The history is provided by the patient and medical records. No language interpreter was used.    Glenn Martin is a 35 y.o. male  with a PMH of HTN who presents to the Emergency Department complaining of right upper quadrant pain. Patient states he initially had right sided low back pain which was sharp in nature and worse with movement. He went to the urgent care 4 days ago and was told it was likely a low back strain. He was started on high-dose NSAIDs as well as muscle relaxer and pain medicine. He states he awoke the following morning and felt much better, he continued taking NSAIDs three times daily. Yesterday morning, he awoke with right upper quadrant pain that feels like a "squeezing bubbling pain". Associated symptoms include a heartburn sensation. Pain is worse with eating. No alleviating factors medications tried. No history of similar sxs. Endorses occasional social ETOH use. He spoke with the urgent care who prescribed the medication, who recommended to discontinue NSAIDs, therefore he has not taken his medications in the last 24 hours, however symptoms persisted.   Past Medical History:  Diagnosis Date  . Hypertension     There are no active problems to display for this patient.   Past Surgical History:  Procedure Laterality Date  . HIP SURGERY Bilateral   . SHOULDER SURGERY         Home Medications    Prior to Admission medications   Medication Sig Start Date End Date Taking? Authorizing Provider  atenolol-chlorthalidone (TENORETIC) 50-25 MG tablet Take 1 tablet by mouth daily.   Yes Historical Provider, MD  dexamethasone (DECADRON) 2 MG tablet Take 2 mg by mouth 2 (two) times daily with a meal.   Yes Historical Provider, MD    HYDROcodone-acetaminophen (NORCO/VICODIN) 5-325 MG tablet Take 1 tablet by mouth every 6 (six) hours as needed for moderate pain.   Yes Historical Provider, MD  methocarbamol (ROBAXIN) 500 MG tablet Take 1 tablet (500 mg total) by mouth 2 (two) times daily. 12/30/14  Yes Hannah Muthersbaugh, PA-C  amoxicillin (AMOXIL) 500 MG capsule Take 1 capsule (500 mg total) by mouth 3 (three) times daily. Patient not taking: Reported on 10/31/2015 10/05/14   Garlon HatchetLisa M Sanders, PA-C  cyclobenzaprine (FLEXERIL) 10 MG tablet Take 1 tablet (10 mg total) by mouth 3 (three) times daily as needed for muscle spasms. Patient not taking: Reported on 10/31/2015 03/03/15   Danelle BerryLeisa Tapia, PA-C  docusate sodium (COLACE) 100 MG capsule Take 1 capsule (100 mg total) by mouth 2 (two) times daily as needed for mild constipation or moderate constipation. Patient not taking: Reported on 10/31/2015 06/01/14   Trixie DredgeEmily West, PA-C  hydrocortisone (ANUSOL-HC) 25 MG suppository Place 1 suppository (25 mg total) rectally 2 (two) times daily. For 7 days Patient not taking: Reported on 10/31/2015 06/01/14   Trixie DredgeEmily West, PA-C  ibuprofen (ADVIL,MOTRIN) 800 MG tablet Take 1 tablet (800 mg total) by mouth 3 (three) times daily. Patient not taking: Reported on 10/31/2015 03/03/15   Danelle BerryLeisa Tapia, PA-C  lisinopril-hydrochlorothiazide (PRINZIDE,ZESTORETIC) 20-25 MG tablet Take 1 tablet by mouth daily. Patient not taking: Reported on 10/31/2015 10/11/15   Doug SouSam Jacubowitz, MD  pantoprazole (PROTONIX) 20 MG tablet Take 1  tablet (20 mg total) by mouth daily. 10/31/15   Chase Picket Ward, PA-C  traMADol (ULTRAM) 50 MG tablet Take 1 tablet (50 mg total) by mouth every 12 (twelve) hours as needed for severe pain. Patient not taking: Reported on 10/31/2015 03/03/15   Danelle Berry, PA-C    Family History No family history on file.  Social History Social History  Substance Use Topics  . Smoking status: Former Smoker    Packs/day: 1.00    Types: Cigarettes     Quit date: 05/29/2015  . Smokeless tobacco: Never Used  . Alcohol use Yes     Comment: occasionally      Allergies   Review of patient's allergies indicates no known allergies.   Review of Systems Review of Systems  Constitutional: Negative for fever.  HENT: Negative for congestion.   Eyes: Negative for visual disturbance.  Respiratory: Negative for cough and shortness of breath.   Cardiovascular: Negative.   Gastrointestinal: Positive for abdominal pain. Negative for diarrhea, nausea and vomiting.  Genitourinary: Negative for dysuria.  Musculoskeletal: Positive for back pain. Negative for neck pain.  Skin: Negative for rash.  Neurological: Negative for headaches.     Physical Exam Updated Vital Signs BP (!) 145/101 (BP Location: Left Arm)   Pulse 64   Temp 97.7 F (36.5 C) (Oral)   Resp 16   SpO2 100%   Physical Exam  Constitutional: He is oriented to person, place, and time. He appears well-developed and well-nourished. No distress.  HENT:  Head: Normocephalic and atraumatic.  Cardiovascular: Normal rate, regular rhythm and normal heart sounds.   No murmur heard. Pulmonary/Chest: Effort normal and breath sounds normal. No respiratory distress. He has no wheezes. He has no rales.  Abdominal: Soft. Bowel sounds are normal. He exhibits no distension. There is no rebound and no guarding.  TTP of RUQ. Negative Murphy's.  Musculoskeletal:  No midline C/T/L tenderness. TTP of right paraspinal musculature. No CVA tenderness.   Neurological: He is alert and oriented to person, place, and time.  Skin: Skin is warm and dry.  Nursing note and vitals reviewed.    ED Treatments / Results  Labs (all labs ordered are listed, but only abnormal results are displayed) Labs Reviewed  COMPREHENSIVE METABOLIC PANEL - Abnormal; Notable for the following:       Result Value   BUN 24 (*)    Creatinine, Ser 1.50 (*)    Calcium 8.8 (*)    GFR calc non Af Amer 59 (*)    All other  components within normal limits  LIPASE, BLOOD  CBC  URINALYSIS, ROUTINE W REFLEX MICROSCOPIC (NOT AT [facility])    EKG  EKG Interpretation None       Radiology US Renal  Result Date: 10/31/2015 CLINICAL DATA:  Right upper quadrant abdominal/right flank pain for several days. EXAM: RENAL / URINARY TRACT ULTRASOUND COMPLETE COMPARISON:  None. FINDINGS: Right Kidney: Length: 11.2 cm. Echogenicity within normal limits. No mass or hydronephrosis visualized. Left Kidney: Length: 11.4 cm. Echogenicity within normal limits. No mass or hydronephrosis visualized. Bladder: Minimally distended and grossly normal bladder. Prostate appears mildly enlarged. IMPRESSION: 1. Normal kidneys and bladder.  No hydronephrosis. 2. Mildly enlarged prostate. Electronically Signed   By: Delbert Phenix M.D.   On: 10/31/2015 12:10   US Abdomen Limited Ruq  Result Date: 10/31/2015 CLINICAL DATA:  Right upper quadrant pain EXAM: US ABDOMEN LIMITED - RIGHT UPPER QUADRANT COMPARISON:  None. FINDINGS: Gallbladder: No gallstones or wall thickening visualized.  No sonographic Murphy sign noted by sonographer. Common bile duct: Diameter: 2.6 mm. Liver: No focal lesion identified. Within normal limits in parenchymal echogenicity. IMPRESSION: No acute abnormality noted. Electronically Signed   By: Alcide Clever M.D.   On: 10/31/2015 12:03    Procedures Procedures (including critical care time)  Medications Ordered in ED Medications  gi cocktail (Maalox,Lidocaine,Donnatal) (30 mLs Oral Given 10/31/15 1312)  sodium chloride 0.9 % bolus 1,000 mL (0 mLs Intravenous Stopped 10/31/15 1313)  ondansetron (ZOFRAN) injection 4 mg (4 mg Intravenous Given 10/31/15 1117)  famotidine (PEPCID) IVPB 20 mg premix (0 mg Intravenous Stopped 10/31/15 1313)     Initial Impression / Assessment and Plan / ED Course  I have reviewed the triage vital signs and the nursing notes.  Pertinent labs & imaging results that were available during my care  of the patient were reviewed by me and considered in my medical decision making (see chart for details).  Clinical Course   Glenn Martin is a 35 y.o. male who presents to ED for RUQ pain. Recently treated for likely right sided back strain by urgent care and started on NSAID's. TTP of RUQ - nonsurgical abdomen. Symptoms likely 2/2 NSAID induced gastritis vs. Ulcer. Will obtain RUQ ultrasound - given patient also had right-sided back pain prior to abdominal pain will add on renal ultrasound to assess for possible stone.   Ultrasounds unremarkable. Mildly enlarged prostate incidentally noted and patient informed of incidental finding. Symptoms improved following Pepcid and GI cocktail. Will place on Protonix. Patient has a follow-up appointment with his primary care provider on November 8. I encouraged him to keep this scheduled appointment. Discontinue NSAIDs. Dietary modifications and ETOH cessation also discussed. Reasons to return to the ED were discussed and all questions answered.   Final Clinical Impressions(s) / ED Diagnoses   Final diagnoses:  RUQ pain    New Prescriptions Discharge Medication List as of 10/31/2015 12:49 PM    START taking these medications   Details  pantoprazole (PROTONIX) 20 MG tablet Take 1 tablet (20 mg total) by mouth daily., Starting Thu 10/31/2015, Print         [employer] Ward, PA-C 10/31/15 1339    Benjiman Core, MD 10/31/15 [phone]

## 2015-10-31 NOTE — ED Triage Notes (Signed)
Pt states that he has been having rt flank pain, abd cramping since Saturday.  States that it was so painful he could not walk.  Went to urgent care on Sunday and they gave him some medicine that helped but on Monday, medicine was making him feel "weird".  States that he feels worse after eating and has been having heart burn.

## 2015-10-31 NOTE — ED Notes (Signed)
U/S being done and U/S tech has requested that the gi cocktail be given once the U/S has been completed.

## 2016-01-01 ENCOUNTER — Other Ambulatory Visit: Payer: Self-pay | Admitting: Orthopedic Surgery

## 2016-01-01 DIAGNOSIS — M25511 Pain in right shoulder: Secondary | ICD-10-CM

## 2016-01-03 ENCOUNTER — Encounter (HOSPITAL_BASED_OUTPATIENT_CLINIC_OR_DEPARTMENT_OTHER): Payer: Self-pay | Admitting: *Deleted

## 2016-01-03 ENCOUNTER — Emergency Department (HOSPITAL_BASED_OUTPATIENT_CLINIC_OR_DEPARTMENT_OTHER): Payer: Medicaid Other

## 2016-01-03 ENCOUNTER — Emergency Department (HOSPITAL_BASED_OUTPATIENT_CLINIC_OR_DEPARTMENT_OTHER)
Admission: EM | Admit: 2016-01-03 | Discharge: 2016-01-03 | Disposition: A | Discharge status: Home / Self Care | Payer: Medicaid Other | Attending: Emergency Medicine | Admitting: Emergency Medicine

## 2016-01-03 DIAGNOSIS — I10 Essential (primary) hypertension: Secondary | ICD-10-CM | POA: Insufficient documentation

## 2016-01-03 DIAGNOSIS — R079 Chest pain, unspecified: Secondary | ICD-10-CM | POA: Insufficient documentation

## 2016-01-03 DIAGNOSIS — Z79899 Other long term (current) drug therapy: Secondary | ICD-10-CM | POA: Insufficient documentation

## 2016-01-03 DIAGNOSIS — M79662 Pain in left lower leg: Secondary | ICD-10-CM | POA: Insufficient documentation

## 2016-01-03 DIAGNOSIS — M79669 Pain in unspecified lower leg: Secondary | ICD-10-CM

## 2016-01-03 DIAGNOSIS — M791 Myalgia: Secondary | ICD-10-CM | POA: Diagnosis not present

## 2016-01-03 DIAGNOSIS — F17210 Nicotine dependence, cigarettes, uncomplicated: Secondary | ICD-10-CM | POA: Insufficient documentation

## 2016-01-03 LAB — D-DIMER, QUANTITATIVE (NOT AT ARMC)

## 2016-01-03 LAB — TROPONIN I: Troponin I: <0.03 ng/mL (ref <0.03)

## 2016-01-03 NOTE — Discharge Instructions (Signed)
Your ultrasound of your leg was normal today. Your blood test of your heart were normal. This is likely a pulled muscle. You need to continue to apply ice and heat, rest, and elevate your right leg. You also need to make sure you are stretching it. Please follow up with your [facility] doctor in regards to today visit and if your chest pain persists. Return to the ED if he develops exertional chest pain, shortness of breath, worsening pain, nausea, sweating. Continue your acid reflux regimen as needed.
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
## 2016-01-03 NOTE — ED Notes (Signed)
Patient transported to Ultrasound 
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
## 2016-01-16 ENCOUNTER — Ambulatory Visit
Admission: RE | Admit: 2016-01-16 | Discharge: 2016-01-16 | Disposition: A | Discharge status: Home / Self Care | Payer: Medicaid Other | Source: Ambulatory Visit | Attending: Orthopedic Surgery | Admitting: Orthopedic Surgery

## 2016-01-16 DIAGNOSIS — M25511 Pain in right shoulder: Secondary | ICD-10-CM

## 2016-01-16 MED ORDER — IOPAMIDOL (ISOVUE-M 200) INJECTION 41%
15.0000 mL | Freq: Once | INTRAMUSCULAR | Status: AC
  Administered 2016-01-16: 15 mL via INTRA_ARTICULAR

## 2016-06-22 ENCOUNTER — Encounter (HOSPITAL_BASED_OUTPATIENT_CLINIC_OR_DEPARTMENT_OTHER): Payer: Self-pay | Admitting: *Deleted

## 2016-06-22 DIAGNOSIS — K5900 Constipation, unspecified: Secondary | ICD-10-CM | POA: Insufficient documentation

## 2016-06-22 DIAGNOSIS — R197 Diarrhea, unspecified: Secondary | ICD-10-CM | POA: Insufficient documentation

## 2016-06-22 DIAGNOSIS — R1084 Generalized abdominal pain: Secondary | ICD-10-CM | POA: Diagnosis present

## 2016-06-22 DIAGNOSIS — Z5321 Procedure and treatment not carried out due to patient leaving prior to being seen by health care provider: Secondary | ICD-10-CM | POA: Insufficient documentation

## 2016-06-22 LAB — CBC WITH DIFFERENTIAL/PLATELET
BASOS ABS: 0 10*3/uL (ref 0.0–0.1)
Basophils Relative: 1 %
EOS PCT: 3 %
Eosinophils Absolute: 0.2 10*3/uL (ref 0.0–0.7)
HCT: 44.4 % (ref 39.0–52.0)
Hemoglobin: 15.1 g/dL (ref 13.0–17.0)
LYMPHS PCT: 26 %
Lymphs Abs: 1.3 10*3/uL (ref 0.7–4.0)
MCH: 27.2 pg (ref 26.0–34.0)
MCHC: 34 g/dL (ref 30.0–36.0)
MCV: 79.9 fL (ref 78.0–100.0)
MONO ABS: 0.6 10*3/uL (ref 0.1–1.0)
MONOS PCT: 12 %
Neutro Abs: 3 10*3/uL (ref 1.7–7.7)
Neutrophils Relative %: 58 %
PLATELETS: 259 10*3/uL (ref 150–400)
RBC: 5.56 MIL/uL (ref 4.22–5.81)
RDW: 14.2 % (ref 11.5–15.5)
WBC: 5.1 10*3/uL (ref 4.0–10.5)

## 2016-06-22 NOTE — ED Triage Notes (Signed)
Generalized abdominal pain x 6 days. Pain started after using a machine to exercise his abd's. He felt like his abdomen exploded and had diarrhea afterward followed by constipation. He has been using laxatives. Yesterday he had diarrhea and his stool was red.

## 2016-06-23 ENCOUNTER — Emergency Department (HOSPITAL_BASED_OUTPATIENT_CLINIC_OR_DEPARTMENT_OTHER)
Admission: EM | Admit: 2016-06-23 | Discharge: 2016-06-23 | Disposition: A | Discharge status: Home / Self Care | Payer: Medicaid Other | Attending: Emergency Medicine | Admitting: Emergency Medicine

## 2016-06-23 NOTE — ED Notes (Signed)
Called pt x2 no answer

## 2016-08-20 ENCOUNTER — Emergency Department (HOSPITAL_BASED_OUTPATIENT_CLINIC_OR_DEPARTMENT_OTHER): Payer: Medicaid Other

## 2016-08-20 ENCOUNTER — Emergency Department (HOSPITAL_BASED_OUTPATIENT_CLINIC_OR_DEPARTMENT_OTHER)
Admission: EM | Admit: 2016-08-20 | Discharge: 2016-08-21 | Disposition: A | Discharge status: Home / Self Care | Payer: Medicaid Other | Attending: Physician Assistant | Admitting: Physician Assistant

## 2016-08-20 ENCOUNTER — Encounter (HOSPITAL_BASED_OUTPATIENT_CLINIC_OR_DEPARTMENT_OTHER): Payer: Self-pay

## 2016-08-20 DIAGNOSIS — Z79899 Other long term (current) drug therapy: Secondary | ICD-10-CM | POA: Diagnosis not present

## 2016-08-20 DIAGNOSIS — Z87891 Personal history of nicotine dependence: Secondary | ICD-10-CM | POA: Insufficient documentation

## 2016-08-20 DIAGNOSIS — I10 Essential (primary) hypertension: Secondary | ICD-10-CM | POA: Insufficient documentation

## 2016-08-20 DIAGNOSIS — R1011 Right upper quadrant pain: Secondary | ICD-10-CM | POA: Diagnosis present

## 2016-08-20 DIAGNOSIS — R110 Nausea: Secondary | ICD-10-CM | POA: Diagnosis not present

## 2016-08-20 LAB — CBC WITH DIFFERENTIAL/PLATELET
Basophils Absolute: 0 K/uL (ref 0.0–0.1)
Basophils Relative: 1 %
Eosinophils Absolute: 0.1 K/uL (ref 0.0–0.7)
Eosinophils Relative: 3 %
HCT: 40.3 % (ref 39.0–52.0)
Hemoglobin: 13.6 g/dL (ref 13.0–17.0)
Lymphocytes Relative: 29 %
Lymphs Abs: 1.5 K/uL (ref 0.7–4.0)
MCH: 26.6 pg (ref 26.0–34.0)
MCHC: 33.7 g/dL (ref 30.0–36.0)
MCV: 78.7 fL (ref 78.0–100.0)
Monocytes Absolute: 0.6 K/uL (ref 0.1–1.0)
Monocytes Relative: 13 %
Neutro Abs: 2.8 K/uL (ref 1.7–7.7)
Neutrophils Relative %: 54 %
Platelets: 260 K/uL (ref 150–400)
RBC: 5.12 MIL/uL (ref 4.22–5.81)
RDW: 13.8 % (ref 11.5–15.5)
WBC: 5 K/uL (ref 4.0–10.5)

## 2016-08-20 LAB — COMPREHENSIVE METABOLIC PANEL WITH GFR
ALT: 36 U/L (ref 17–63)
AST: 34 U/L (ref 15–41)
Albumin: 4 g/dL (ref 3.5–5.0)
Alkaline Phosphatase: 63 U/L (ref 38–126)
Anion gap: 9 (ref 5–15)
BUN: 15 mg/dL (ref 6–20)
CO2: 28 mmol/L (ref 22–32)
Calcium: 9 mg/dL (ref 8.9–10.3)
Chloride: 101 mmol/L (ref 101–111)
Creatinine, Ser: 1.36 mg/dL — ABNORMAL HIGH (ref 0.61–1.24)
GFR calc Af Amer: >60 mL/min
GFR calc non Af Amer: >60 mL/min
Glucose, Bld: 90 mg/dL (ref 65–99)
Potassium: 3.5 mmol/L (ref 3.5–5.1)
Sodium: 138 mmol/L (ref 135–145)
Total Bilirubin: 0.5 mg/dL (ref 0.3–1.2)
Total Protein: 7 g/dL (ref 6.5–8.1)

## 2016-08-20 LAB — URINALYSIS, ROUTINE W REFLEX MICROSCOPIC
Bilirubin Urine: NEGATIVE
Glucose, UA: NEGATIVE mg/dL
HGB URINE DIPSTICK: NEGATIVE
Ketones, ur: NEGATIVE mg/dL
LEUKOCYTES UA: NEGATIVE
Nitrite: NEGATIVE
PH: 6 (ref 5.0–8.0)
Protein, ur: NEGATIVE mg/dL
Specific Gravity, Urine: 1.017 (ref 1.005–1.030)

## 2016-08-20 LAB — LIPASE, BLOOD: Lipase: 26 U/L (ref 11–51)

## 2016-08-20 MED ORDER — SODIUM CHLORIDE 0.9 % IV BOLUS (SEPSIS)
1000.0000 mL | Freq: Once | INTRAVENOUS | Status: AC
  Administered 2016-08-20: 1000 mL via INTRAVENOUS

## 2016-08-20 NOTE — ED Notes (Signed)
ED Provider at bedside. 

## 2016-08-20 NOTE — ED Triage Notes (Addendum)
C/o RUQ pain x 4 days-states "feels like there is a knot there when i lay down"-NAD-steady gait

## 2016-08-20 NOTE — ED Notes (Signed)
ED Provider at bedside discussing test results and updating the plan of care and the reason for the ordered CT scan.

## 2016-08-20 NOTE — ED Notes (Signed)
Pt in Ultrasound will obtain vitals when pt returns.

## 2016-08-21 ENCOUNTER — Emergency Department (HOSPITAL_BASED_OUTPATIENT_CLINIC_OR_DEPARTMENT_OTHER): Payer: Medicaid Other

## 2016-08-21 MED ORDER — IOPAMIDOL (ISOVUE-300) INJECTION 61%
100.0000 mL | Freq: Once | INTRAVENOUS | Status: AC | PRN
  Administered 2016-08-21: 100 mL via INTRAVENOUS

## 2016-08-21 MED ORDER — IOPAMIDOL (ISOVUE-300) INJECTION 61%
25.0000 mL | Freq: Once | INTRAVENOUS | Status: AC | PRN
  Administered 2016-08-21: 01:00:00 via INTRAVENOUS

## 2016-08-21 MED ORDER — MORPHINE SULFATE (PF) 4 MG/ML IV SOLN
4.0000 mg | Freq: Once | INTRAVENOUS | Status: AC
  Administered 2016-08-21: 4 mg via INTRAVENOUS
  Filled 2016-08-21: qty 1

## 2016-08-21 NOTE — ED Provider Notes (Signed)
[facility]   CSN: [id number] Arrival date & time: 08/20/16  2003     History   Chief Complaint Chief Complaint  Patient presents with  . Abdominal Pain    HPI Glenn Martin is a 36 y.o. male history of hypertension and acid reflux presenting with 4 days of postprandial colicky intermittent right upper quadrant pain with associated nausea but no vomiting and greasy stools. He reports that he cannot lie on his right side due to pain. He has taken antiacid medications without relief. He also reports some discomfort towards his umbilicus. Denies fever, chills, blood in the stool, dysuria, hematuria or other symptoms.  HPI  Past Medical History:  Diagnosis Date  . Hypertension     There are no active problems to display for this patient.   Past Surgical History:  Procedure Laterality Date  . HIP SURGERY Bilateral   . SHOULDER SURGERY         Home Medications    Prior to Admission medications   Medication Sig Start Date End Date Taking? Authorizing Provider  CHLORTHALIDONE PO Take by mouth.   Yes [provider]  HYDRALAZINE-HCTZ PO Take by mouth.   Yes [provider]  Linaclotide (LINZESS PO) Take by mouth.   Yes [provider]  atenolol-chlorthalidone (TENORETIC) 50-25 MG tablet Take 1 tablet by mouth daily.    [provider]  pantoprazole (PROTONIX) 20 MG tablet Take 20 mg by mouth daily.    [provider]    Family History No family history on file.  Social History Social History  Substance Use Topics  . Smoking status: Former Smoker    Packs/day: 0.00  . Smokeless tobacco: Never Used  . Alcohol use Yes     Comment: occ     Allergies   Patient has no known allergies.   Review of Systems Review of Systems  Constitutional: Negative for chills and fever.  Respiratory: Negative for chest tightness, shortness of breath, wheezing and stridor.   Cardiovascular: Negative for chest  pain, palpitations and leg swelling.  Gastrointestinal: Positive for abdominal pain, diarrhea and nausea. Negative for vomiting.       He reports soft nonbloody greasy stools.  Genitourinary: Negative for difficulty urinating, discharge, dysuria, flank pain, frequency and hematuria.  Musculoskeletal: Negative for myalgias, neck pain and neck stiffness.  Skin: Negative for color change and pallor.  Neurological: Negative for dizziness, weakness and light-headedness.     Physical Exam Updated Vital Signs BP (!) 158/98 (BP Location: Left Arm) Comment: Left forearm  Pulse 64   Temp 98.3 F (36.8 C) (Oral)   Resp 18   Ht 5\' 9"  (1.753 m)   Wt (!) 142 kg (313 lb 0.9 oz)   SpO2 96%   BMI 46.23 kg/m   Physical Exam  Constitutional: He appears well-developed and well-nourished. No distress.  Afebrile, nontoxic-appearing, lying in bed in mild discomfort.  HENT:  Head: Normocephalic and atraumatic.  Eyes: Conjunctivae are normal. Right eye exhibits no discharge. Left eye exhibits no discharge. No scleral icterus.  Neck: Normal range of motion. Neck supple.  Cardiovascular: Normal rate, regular rhythm and normal heart sounds.   No murmur heard. Pulmonary/Chest: Effort normal and breath sounds normal. No respiratory distress.  Abdominal: Soft. Bowel sounds are normal. He exhibits no distension and no mass. There is tenderness. There is no rebound and no guarding.  Tenderness to palpation of right upper quadrant with mild discomfort on palpation of umbilicus and  RLQ. No palpated masses. No costovertebral angle tenderness. positive murphy's sign Negative McBurney's point tenderness   Musculoskeletal: Normal range of motion. He exhibits no edema or deformity.  Neurological: He is alert.  Skin: Skin is warm and dry. No rash noted. He is not diaphoretic. No erythema. No pallor.  Psychiatric: He has a normal mood and affect.  Nursing note and vitals reviewed.    ED Treatments / Results    Labs (all labs ordered are listed, but only abnormal results are displayed) Labs Reviewed  COMPREHENSIVE METABOLIC PANEL - Abnormal; Notable for the following:       Result Value   Creatinine, Ser 1.36 (*)    All other components within normal limits  URINALYSIS, ROUTINE W REFLEX MICROSCOPIC  CBC WITH DIFFERENTIAL/PLATELET  LIPASE, BLOOD   BUN  Date Value Ref Range Status  08/20/2016 15 6 - 20 mg/dL Final  09/81/1914 24 (H) 6 - 20 mg/dL Final  78/29/5621 13 6 - 20 mg/dL Final   Creatinine, Ser  Date Value Ref Range Status  08/20/2016 1.36 (H) 0.61 - 1.24 mg/dL Final  30/86/5784 6.96 (H) 0.61 - 1.24 mg/dL Final  29/52/8413 2.44 0.61 - 1.24 mg/dL Final     EKG  EKG Interpretation None       Radiology Ct Abdomen Pelvis W Contrast  Result Date: 08/21/2016 CLINICAL DATA:  Right upper quadrant pain for 4 days EXAM: CT ABDOMEN AND PELVIS WITH CONTRAST TECHNIQUE: Multidetector CT imaging of the abdomen and pelvis was performed using the standard protocol following bolus administration of intravenous contrast. CONTRAST:  ISOVUE-300 IOPAMIDOL (ISOVUE-300) INJECTION 61%, <See Chart> ISOVUE-300 IOPAMIDOL (ISOVUE-300) INJECTION 61% COMPARISON:  08/20/2016 ultrasound FINDINGS: Lower chest: No acute abnormality. Hepatobiliary: No focal liver abnormality is seen. No gallstones, gallbladder wall thickening, or biliary dilatation. Pancreas: Unremarkable. No pancreatic ductal dilatation or surrounding inflammatory changes. Spleen: Normal in size without focal abnormality. Adrenals/Urinary Tract: Adrenal glands are unremarkable. Kidneys are normal, without renal calculi, focal lesion, or hydronephrosis. Bladder is unremarkable. Stomach/Bowel: Stomach is within normal limits. Appendix appears normal. No evidence of bowel wall thickening, distention, or inflammatory changes. Vascular/Lymphatic: No significant vascular findings are present. No enlarged abdominal or pelvic lymph nodes.  Reproductive: Prostate is unremarkable. Other: Negative for free air or free fluid. Small fat in the umbilicus. Musculoskeletal: No acute or suspicious bone lesion. Status post bilateral screw fixation of proximal femurs presumably for slipped epiphysis. IMPRESSION: No CT evidence for acute intra-abdominal or pelvic pathology. Electronically Signed   By: Jasmine Pang M.D.   On: 08/21/2016 01:51   US Abdomen Limited Ruq  Result Date: 08/20/2016 CLINICAL DATA:  Right upper quadrant pain EXAM: ULTRASOUND ABDOMEN LIMITED RIGHT UPPER QUADRANT COMPARISON:  10/31/2015 FINDINGS: Gallbladder: Small and contracted which limits evaluation. Normal wall thickness. Questionable punctate stone near the gallbladder neck. No sonographic Eulah Pont indicated Common bile duct: Diameter: 3 mm Liver: Mild increased echogenicity. IMPRESSION: 1. Small contracted gallbladder limits evaluation. Possible punctate stone near the gallbladder neck. Negative for wall thickening or biliary dilatation 2. Slight increased liver echogenicity suggesting steatosis Electronically Signed   By: Jasmine Pang M.D.   On: 08/20/2016 22:59    Procedures Procedures (including critical care time)  Medications Ordered in ED Medications  sodium chloride 0.9 % bolus 1,000 mL (0 mLs Intravenous Stopped 08/20/16 2316)  morphine 4 MG/ML injection 4 mg (4 mg Intravenous Given 08/21/16 0118)  iopamidol (ISOVUE-300) 61 % injection 100 mL (100 mLs Intravenous Contrast Given 08/21/16 0128)  iopamidol (ISOVUE-300)  61 % injection 25 mL ( Intravenous Contrast Given 08/21/16 0129)     Initial Impression / Assessment and Plan / ED Course  I have reviewed the triage vital signs and the nursing notes.  Pertinent labs & imaging results that were available during my care of the patient were reviewed by me and considered in my medical decision making (see chart for details).     Patient presents with colicky postprandial right upper quadrant pain aggravated by  lying on his right side. No history of nephrolithiasis, no dysuria, hematuria. On exam patient has tenderness to palpation of right upper quadrant and mild discomfort to palpation around the umbilicus and right lower quadrant  Labs unremarkable. No hematuria Ultrasound negative for cholecystitis. Ordered CT abdomen pelvis  CT ABD/PELV without intra-abdominal abnormality. On reassessment, patient reported significant improvement, successful by mouth challenge, no nausea.  Pain may be musculoskeletal or gastroenteritis. Will discharge patient home with follow-up with PCP. Advised to slowly reintroduce foods and stay well-hydrated.  Discussed strict return precautions and advised to return to the emergency department if experiencing any new or worsening symptoms. Instructions were understood and patient agreed with discharge plan. Final Clinical Impressions(s) / ED Diagnoses   Final diagnoses:  RUQ pain  Nausea    New Prescriptions New Prescriptions   No medications on file     Gregary Cromer 08/21/16 0250    Abelino Derrick, MD 08/26/16 [phone]

## 2016-08-21 NOTE — Discharge Instructions (Signed)
As discussed, your blood work and imaging were negative and reassuring today. CT scan without acute intraabdominal abnormalities. Start reintroducing foods slowly starting with broths, soups and bland foods. Make sure that you stay well-hydrated keeping your urine clear. Follow up with your primary care provider. Return if you experience worsening abdominal pain, vomiting, fever, chills or other concerning symptoms in the meantime.

## 2017-08-25 ENCOUNTER — Emergency Department (HOSPITAL_COMMUNITY): Payer: Medicaid Other

## 2017-08-25 ENCOUNTER — Encounter (HOSPITAL_COMMUNITY): Payer: Self-pay | Admitting: Emergency Medicine

## 2017-08-25 ENCOUNTER — Emergency Department (HOSPITAL_COMMUNITY)
Admission: EM | Admit: 2017-08-25 | Discharge: 2017-08-25 | Disposition: A | Discharge status: Home / Self Care | Payer: Medicaid Other | Attending: Emergency Medicine | Admitting: Emergency Medicine

## 2017-08-25 DIAGNOSIS — R079 Chest pain, unspecified: Secondary | ICD-10-CM | POA: Diagnosis present

## 2017-08-25 DIAGNOSIS — F17210 Nicotine dependence, cigarettes, uncomplicated: Secondary | ICD-10-CM | POA: Diagnosis not present

## 2017-08-25 DIAGNOSIS — Z79899 Other long term (current) drug therapy: Secondary | ICD-10-CM | POA: Diagnosis not present

## 2017-08-25 DIAGNOSIS — M791 Myalgia, unspecified site: Secondary | ICD-10-CM

## 2017-08-25 DIAGNOSIS — I10 Essential (primary) hypertension: Secondary | ICD-10-CM | POA: Insufficient documentation

## 2017-08-25 LAB — CK: Total CK: 689 U/L — ABNORMAL HIGH (ref 49–397)

## 2017-08-25 LAB — CBC
HCT: 44 % (ref 39.0–52.0)
Hemoglobin: 15 g/dL (ref 13.0–17.0)
MCH: 27.1 pg (ref 26.0–34.0)
MCHC: 34.1 g/dL (ref 30.0–36.0)
MCV: 79.4 fL (ref 78.0–100.0)
Platelets: 252 10*3/uL (ref 150–400)
RBC: 5.54 MIL/uL (ref 4.22–5.81)
RDW: 13.6 % (ref 11.5–15.5)
WBC: 4.3 10*3/uL (ref 4.0–10.5)

## 2017-08-25 LAB — BASIC METABOLIC PANEL
Anion gap: 10 (ref 5–15)
BUN: 17 mg/dL (ref 6–20)
CO2: 26 mmol/L (ref 22–32)
Calcium: 9.2 mg/dL (ref 8.9–10.3)
Chloride: 103 mmol/L (ref 98–111)
Creatinine, Ser: 1.47 mg/dL — ABNORMAL HIGH (ref 0.61–1.24)
GFR calc Af Amer: >60 mL/min (ref >60)
GFR calc non Af Amer: 60 mL/min — ABNORMAL LOW (ref >60)
Glucose, Bld: 134 mg/dL — ABNORMAL HIGH (ref 70–99)
Potassium: 3.4 mmol/L — ABNORMAL LOW (ref 3.5–5.1)
Sodium: 139 mmol/L (ref 135–145)

## 2017-08-25 LAB — TROPONIN I
Troponin I: <0.03 ng/mL (ref <0.03)
Troponin I: <0.03 ng/mL (ref <0.03)

## 2017-08-25 NOTE — ED Triage Notes (Signed)
Pt c/o chest, back, neck, muscle pains for couple weeks. Reports has a CK problem and wanted to make sure nothing bad was going on. Reports left chest mainly but all over with "muscle palpitations".

## 2017-08-26 NOTE — ED Provider Notes (Signed)
[facility]-EMERGENCY DEPT Provider Note   CSN: [id number] Arrival date & time: 08/25/17  4098     History   Chief Complaint Chief Complaint  Patient presents with  . Chest Pain  . Back Pain  . Muscle Pain    HPI Glenn Martin is a 37 y.o. male.  HPI   37 year old male with multiple pain complaints.  Is complaining of pain in his chest, upper back, neck and bilateral thighs.  Some of his pain complaints are chronic and he is followed by neurology and rheumatology for this.  He states he has a chronically mildly elevated CK level.  Today he began having chest pain which is different with what he typically feels.  Onset shortly before arrival while driving.  Pain is in the upper sternal area.  He has a history of reflux but states that this feels different.  Symptoms have been constant since onset.  No appreciable exacerbating relieving factors.  Denies any respiratory complaints.  No nausea, palpitations.  No unusual leg swelling.  Past Medical History:  Diagnosis Date  . Hypertension     There are no active problems to display for this patient.   Past Surgical History:  Procedure Laterality Date  . HIP SURGERY Bilateral   . SHOULDER SURGERY          Home Medications    Prior to Admission medications   Medication Sig Start Date End Date Taking? Authorizing Provider  albuterol (PROVENTIL HFA;VENTOLIN HFA) 108 (90 Base) MCG/ACT inhaler Inhale 1-2 puffs into the lungs every 6 (six) hours as needed for wheezing or shortness of breath.   Yes [provider]  amLODipine (NORVASC) 5 MG tablet Take 5 mg by mouth daily.   Yes [provider]  atenolol (TENORMIN) 25 MG tablet Take 50 mg by mouth daily. 07/08/17  Yes [provider]  atorvastatin (LIPITOR) 40 MG tablet Take 40 mg by mouth daily.   Yes [provider]  cetirizine (ZYRTEC) 10 MG tablet Take 10 mg by mouth daily.   Yes [provider]  chlorthalidone  (HYGROTON) 50 MG tablet Take 50 mg by mouth daily. 07/05/17  Yes [provider]  glycopyrrolate (ROBINUL) 2 MG tablet Take 2 mg by mouth daily.   Yes [provider]  linaclotide (LINZESS) 72 MCG capsule Take 72 mcg by mouth daily before breakfast.   Yes [provider]  omeprazole (PRILOSEC) 40 MG capsule Take 40 mg by mouth 2 (two) times daily.   Yes [provider]  psyllium (REGULOID) 0.52 g capsule Take 0.52 g by mouth daily.   Yes [provider]  vitamin C (ASCORBIC ACID) 500 MG tablet Take 500 mg by mouth daily.   Yes [provider]  Vitamin D, Ergocalciferol, (DRISDOL) 50000 units CAPS capsule Take 50,000 Units by mouth every 7 (seven) days. On mondays   Yes [provider]    Family History No family history on file.  Social History Social History   Tobacco Use  . Smoking status: Current Every Day Smoker    Packs/day: 0.00    Types: Cigarettes  . Smokeless tobacco: Never Used  Substance Use Topics  . Alcohol use: Yes    Comment: occ  . Drug use: No     Allergies   Atorvastatin   Review of Systems Review of Systems  All systems reviewed and negative, other than as noted in HPI.  Physical Exam Updated Vital Signs BP 123/85   Pulse Marland Kitchen)  51   Temp 98.2 F (36.8 C) (Oral)   Resp 19   Ht 5\' 11"  (1.803 m)   Wt (!) 146.5 kg   SpO2 96%   BMI 45.05 kg/m   Physical Exam  Constitutional: He appears well-developed and well-nourished. No distress.  HENT:  Head: Normocephalic and atraumatic.  Eyes: Conjunctivae are normal. Right eye exhibits no discharge. Left eye exhibits no discharge.  Neck: Neck supple.  Cardiovascular: Normal rate, regular rhythm and normal heart sounds. Exam reveals no gallop and no friction rub.  No murmur heard. Pulmonary/Chest: Effort normal and breath sounds normal. No respiratory distress.  Abdominal: Soft. He exhibits no distension. There is no tenderness.  Musculoskeletal:  He exhibits no edema or tenderness.  Lower extremities symmetric as compared to each other. No calf tenderness. Negative Homan's. No palpable cords.   Neurological: He is alert.  Skin: Skin is warm and dry.  Psychiatric: He has a normal mood and affect. His behavior is normal. Thought content normal.  Nursing note and vitals reviewed.    ED Treatments / Results  Labs (all labs ordered are listed, but only abnormal results are displayed) Labs Reviewed  BASIC METABOLIC PANEL - Abnormal; Notable for the following components:      Result Value   Potassium 3.4 (*)    Glucose, Bld 134 (*)    Creatinine, Ser 1.47 (*)    GFR calc non Af Amer 60 (*)    All other components within normal limits  CK - Abnormal; Notable for the following components:   Total CK 689 (*)    All other components within normal limits  CBC  TROPONIN I  TROPONIN I  I-STAT TROPONIN, ED    EKG EKG Interpretation  Date/Time:  Wednesday August 25 2017 09:29:05 EDT Ventricular Rate:  77 PR Interval:    QRS Duration: 109 QT Interval:  367 QTC Calculation: 416 R Axis:   36 Text Interpretation:  Sinus rhythm Confirmed by Raeford Razor [phone]) on 08/25/2017 9:45:59 AM   Radiology Dg Chest 2 View  Result Date: 08/25/2017 CLINICAL DATA:  Chest pain radiating into the back for the past 7-10 days. Symptoms are greatest on the left and are associated with cough and chest congestion. Current smoker. History of hypertension. EXAM: CHEST - 2 VIEW COMPARISON:  PA and lateral chest x-ray of June 10, 2008 FINDINGS: The lungs are adequately inflated and clear. The heart and pulmonary vascularity are normal. The mediastinum is normal in width. There is no pleural effusion. The bony thorax exhibits no acute abnormality. IMPRESSION: There is no active cardiopulmonary disease. Electronically Signed   By: David  [place] M.D.   On: 08/25/2017 09:45    Procedures Procedures (including critical care time)  Medications Ordered in  ED Medications - No data to display   Initial Impression / Assessment and Plan / ED Course  I have reviewed the triage vital signs and the nursing notes.  Pertinent labs & imaging results that were available during my care of the patient were reviewed by me and considered in my medical decision making (see chart for details).     37 year old male with chest pain.  Sounds atypical for ACS.  Doubt PE, dissection or other emergent process.  Troponin normal x2.  So the myalgias seem to be more chronic in nature.  He is Artie followed by neurology and rheumatology for this.  Final Clinical Impressions(s) / ED Diagnoses   Final diagnoses:  Chest pain, unspecified type  Myalgia  ED Discharge Orders    None       Raeford RazorKohut, Makela Niehoff, MD 08/26/17 1150

## 2018-02-18 ENCOUNTER — Emergency Department (HOSPITAL_BASED_OUTPATIENT_CLINIC_OR_DEPARTMENT_OTHER)
Admission: EM | Admit: 2018-02-18 | Discharge: 2018-02-18 | Disposition: A | Discharge status: Home / Self Care | Payer: Medicaid Other | Attending: Emergency Medicine | Admitting: Emergency Medicine

## 2018-02-18 ENCOUNTER — Encounter (HOSPITAL_BASED_OUTPATIENT_CLINIC_OR_DEPARTMENT_OTHER): Payer: Self-pay

## 2018-02-18 ENCOUNTER — Other Ambulatory Visit: Payer: Self-pay

## 2018-02-18 ENCOUNTER — Emergency Department (HOSPITAL_BASED_OUTPATIENT_CLINIC_OR_DEPARTMENT_OTHER): Payer: Medicaid Other

## 2018-02-18 DIAGNOSIS — F17210 Nicotine dependence, cigarettes, uncomplicated: Secondary | ICD-10-CM | POA: Insufficient documentation

## 2018-02-18 DIAGNOSIS — Z79899 Other long term (current) drug therapy: Secondary | ICD-10-CM | POA: Diagnosis not present

## 2018-02-18 DIAGNOSIS — R0789 Other chest pain: Secondary | ICD-10-CM | POA: Insufficient documentation

## 2018-02-18 DIAGNOSIS — R079 Chest pain, unspecified: Secondary | ICD-10-CM

## 2018-02-18 DIAGNOSIS — J45909 Unspecified asthma, uncomplicated: Secondary | ICD-10-CM

## 2018-02-18 DIAGNOSIS — R110 Nausea: Secondary | ICD-10-CM | POA: Diagnosis not present

## 2018-02-18 DIAGNOSIS — I10 Essential (primary) hypertension: Secondary | ICD-10-CM | POA: Diagnosis not present

## 2018-02-18 DIAGNOSIS — R197 Diarrhea, unspecified: Secondary | ICD-10-CM | POA: Diagnosis not present

## 2018-02-18 DIAGNOSIS — R05 Cough: Secondary | ICD-10-CM | POA: Diagnosis present

## 2018-02-18 DIAGNOSIS — R198 Other specified symptoms and signs involving the digestive system and abdomen: Secondary | ICD-10-CM | POA: Insufficient documentation

## 2018-02-18 DIAGNOSIS — R059 Cough, unspecified: Secondary | ICD-10-CM

## 2018-02-18 HISTORY — DX: Pure hypercholesterolemia, unspecified: E78.00

## 2018-02-18 HISTORY — DX: Irritable bowel syndrome, unspecified: K58.9

## 2018-02-18 HISTORY — DX: Other seasonal allergic rhinitis: J30.2

## 2018-02-18 HISTORY — DX: Prediabetes: R73.03

## 2018-02-18 LAB — CBC WITH DIFFERENTIAL/PLATELET
Abs Immature Granulocytes: 0.02 10*3/uL (ref 0.00–0.07)
BASOS PCT: 1 %
Basophils Absolute: 0 10*3/uL (ref 0.0–0.1)
EOS PCT: 4 %
Eosinophils Absolute: 0.2 10*3/uL (ref 0.0–0.5)
HCT: 46.1 % (ref 39.0–52.0)
Hemoglobin: 14.6 g/dL (ref 13.0–17.0)
Immature Granulocytes: 0 %
Lymphocytes Relative: 24 %
Lymphs Abs: 1.3 10*3/uL (ref 0.7–4.0)
MCH: 25.9 pg — AB (ref 26.0–34.0)
MCHC: 31.7 g/dL (ref 30.0–36.0)
MCV: 81.7 fL (ref 80.0–100.0)
Monocytes Absolute: 0.8 10*3/uL (ref 0.1–1.0)
Monocytes Relative: 14 %
NEUTROS ABS: 3.1 10*3/uL (ref 1.7–7.7)
NRBC: 0 % (ref 0.0–0.2)
Neutrophils Relative %: 57 %
PLATELETS: 265 10*3/uL (ref 150–400)
RBC: 5.64 MIL/uL (ref 4.22–5.81)
RDW: 14.2 % (ref 11.5–15.5)
WBC: 5.4 10*3/uL (ref 4.0–10.5)

## 2018-02-18 LAB — COMPREHENSIVE METABOLIC PANEL
ALT: 38 U/L (ref 0–44)
AST: 26 U/L (ref 15–41)
Albumin: 4 g/dL (ref 3.5–5.0)
Alkaline Phosphatase: 58 U/L (ref 38–126)
Anion gap: 6 (ref 5–15)
BUN: 17 mg/dL (ref 6–20)
CHLORIDE: 104 mmol/L (ref 98–111)
CO2: 27 mmol/L (ref 22–32)
CREATININE: 1.31 mg/dL — AB (ref 0.61–1.24)
Calcium: 8.9 mg/dL (ref 8.9–10.3)
Glucose, Bld: 100 mg/dL — ABNORMAL HIGH (ref 70–99)
POTASSIUM: 3.6 mmol/L (ref 3.5–5.1)
Sodium: 137 mmol/L (ref 135–145)
TOTAL PROTEIN: 7.2 g/dL (ref 6.5–8.1)
Total Bilirubin: 0.6 mg/dL (ref 0.3–1.2)

## 2018-02-18 LAB — LIPASE, BLOOD: Lipase: 26 U/L (ref 11–51)

## 2018-02-18 LAB — TROPONIN I: Troponin I: <0.03 ng/mL (ref <0.03)

## 2018-02-18 MED ORDER — IPRATROPIUM-ALBUTEROL 0.5-2.5 (3) MG/3ML IN SOLN
3.0000 mL | Freq: Once | RESPIRATORY_TRACT | Status: AC
  Administered 2018-02-18: 3 mL via RESPIRATORY_TRACT
  Filled 2018-02-18: qty 3

## 2018-02-18 MED ORDER — ALBUTEROL SULFATE HFA 108 (90 BASE) MCG/ACT IN AERS
2.0000 | INHALATION_SPRAY | Freq: Once | RESPIRATORY_TRACT | Status: AC
  Administered 2018-02-18: 2 via RESPIRATORY_TRACT
  Filled 2018-02-18: qty 6.7

## 2018-02-18 MED ORDER — ALBUTEROL SULFATE HFA 108 (90 BASE) MCG/ACT IN AERS: 2.0000 | INHALATION_SPRAY | RESPIRATORY_TRACT | 0 refills | Status: AC | PRN

## 2018-02-18 MED ORDER — AEROCHAMBER PLUS FLO-VU MEDIUM MISC
1.0000 | Freq: Once | Status: AC
  Administered 2018-02-18: 1
  Filled 2018-02-18: qty 1

## 2018-02-18 NOTE — ED Notes (Signed)
Patient transported to X-ray 

## 2018-02-18 NOTE — ED Triage Notes (Addendum)
C/o flu like sx x 4 days-also c/o CP, abd pain x 4 days-CP worse this am-NAD-steady gait

## 2018-02-18 NOTE — ED Provider Notes (Signed)
[facility] EMERGENCY DEPARTMENT Provider Note   CSN: [id number] Arrival date & time: 02/18/18  1335     History   Chief Complaint Chief Complaint  Patient presents with  . Cough  . Chest Pain    HPI Glenn Martin is a 38 y.o. male with a past medical history of high cholesterol, hypertension, IBS, prediabetes, seasonal allergies, who presents today for evaluation of cough, and chest pain.  He reports that he has had 3 days of left-sided chest pain.  He reports that initially it started in the left to center part of his lower chest however is now in his left to center upper chest radiating down his arm.  He reports the pain does not radiate up to his neck.  He states that the pain woke him up from his sleep today, the worst is been has been a 5 out of 10, is currently a 3 out of 10.  He reports that he has had a dry cough during this time, he feels like he needs to cough phlegm up however is unable.  He reports that he smokes approximately one third a pack a day.  He denies any fevers, body aches.  He denies any leg swelling, no personal history of blood clots.  He denies any recent surgeries or immobilizations.  No hemoptysis, or hormone use.  He does report feeling like he has postnasal drainage however denies nasal congestion. He reports that today his pain went from a 5 down to a 3 after he had diarrhea.    He reports that he has a longstanding history of stomach problems, chart review shows diagnosis with IBS.  He reports that he has been feeling like he has "bubble guts" and that he is having difficulty digesting food.  He reports nausea yesterday however has not had any vomiting.  He says that his stomach symptoms and discomfort are at his usual baseline level, not new or changed for him.  He does report that he missed taking his omeprazole over the weekend before his symptoms started.   HPI  Past Medical History:  Diagnosis Date  . High cholesterol   . Hypertension     . IBS (irritable bowel syndrome)   . Prediabetes   . Seasonal allergies     There are no active problems to display for this patient.   Past Surgical History:  Procedure Laterality Date  . HIP SURGERY Bilateral   . SHOULDER SURGERY          Home Medications    Prior to Admission medications   Medication Sig Start Date End Date Taking? Authorizing Provider  ezetimibe (ZETIA) 10 MG tablet Take by mouth. 11/12/17  Yes [provider]  gabapentin (NEURONTIN) 300 MG capsule 2 caps in morning, 2 caps at bedtime. 11/18/17  Yes [provider]  albuterol (PROVENTIL HFA;VENTOLIN HFA) 108 (90 Base) MCG/ACT inhaler Inhale 2 puffs into the lungs every 4 (four) hours as needed for wheezing or shortness of breath. 02/18/18   Cristina Gong, PA-C  amLODipine (NORVASC) 5 MG tablet Take 5 mg by mouth daily.    [provider]  atenolol (TENORMIN) 25 MG tablet Take 50 mg by mouth daily. 07/08/17   [provider]  cetirizine (ZYRTEC) 10 MG tablet Take 10 mg by mouth daily.    [provider]  chlorthalidone (HYGROTON) 50 MG tablet Take 50 mg by mouth daily. 07/05/17   [provider]  glycopyrrolate (ROBINUL) 2 MG tablet  Take 2 mg by mouth daily.    [provider]  linaclotide (LINZESS) 72 MCG capsule Take 72 mcg by mouth daily before breakfast.    [provider]  omeprazole (PRILOSEC) 40 MG capsule Take 40 mg by mouth 2 (two) times daily.    [provider]  vitamin C (ASCORBIC ACID) 500 MG tablet Take 500 mg by mouth daily.    [provider]  Vitamin D, Ergocalciferol, (DRISDOL) 50000 units CAPS capsule Take 50,000 Units by mouth every 7 (seven) days. On mondays    [provider]    Family History No family history on file.  Social History Social History   Tobacco Use  . Smoking status: Current Every Day Smoker    Packs/day: 0.00    Types: Cigarettes  . Smokeless tobacco: Never Used   Substance Use Topics  . Alcohol use: Yes    Comment: occ  . Drug use: No     Allergies   Atorvastatin   Review of Systems Review of Systems  Constitutional: Negative for chills and fever.  HENT: Positive for postnasal drip. Negative for congestion and sore throat.   Respiratory: Positive for cough and chest tightness. Negative for shortness of breath.   Cardiovascular: Positive for chest pain. Negative for palpitations and leg swelling.  Gastrointestinal: Positive for diarrhea (unchanged from normal) and nausea. Negative for abdominal pain, anal bleeding, rectal pain and vomiting.  Genitourinary: Negative for dysuria.  Musculoskeletal: Negative for back pain and neck pain.  Skin: Negative for color change.  All other systems reviewed and are negative.    Physical Exam Updated Vital Signs BP 114/74 (BP Location: Left Arm)   Pulse (!) 58   Temp 98.4 F (36.9 C) (Oral)   Resp 16   Ht 5\' 11"  (1.803 m)   Wt (!) 142.9 kg   SpO2 93%   BMI 43.93 kg/m   Physical Exam Vitals signs and nursing note reviewed.  Constitutional:      General: He is not in acute distress.    Appearance: He is well-developed. He is obese.  HENT:     Head: Normocephalic and atraumatic.  Eyes:     Conjunctiva/sclera: Conjunctivae normal.  Neck:     Musculoskeletal: Normal range of motion and neck supple.     Vascular: No JVD.     Trachea: No tracheal deviation.  Cardiovascular:     Rate and Rhythm: Normal rate and regular rhythm.     Pulses:          Radial pulses are 2+ on the right side and 2+ on the left side.       Posterior tibial pulses are 2+ on the right side and 2+ on the left side.     Heart sounds: Normal heart sounds. No murmur.  Pulmonary:     Effort: Pulmonary effort is normal. No respiratory distress.     Breath sounds: Normal breath sounds. No decreased breath sounds, wheezing or rales.  Chest:     Chest wall: Tenderness present.     Comments: His left-sided anterior chest  pain is both re-created and exacerbation with palpation.  He feels the pain also worsened with abduction of the left arm at the shoulder. Abdominal:     Palpations: Abdomen is soft.     Tenderness: There is no abdominal tenderness.  Skin:    General: Skin is warm and dry.  Neurological:     General: No focal deficit present.  Mental Status: He is alert.  Psychiatric:        Mood and Affect: Mood normal.        Behavior: Behavior normal.      ED Treatments / Results  Labs (all labs ordered are listed, but only abnormal results are displayed) Labs Reviewed  COMPREHENSIVE METABOLIC PANEL - Abnormal; Notable for the following components:      Result Value   Glucose, Bld 100 (*)    Creatinine, Ser 1.31 (*)    All other components within normal limits  CBC WITH DIFFERENTIAL/PLATELET - Abnormal; Notable for the following components:   MCH 25.9 (*)    All other components within normal limits  LIPASE, BLOOD  TROPONIN I    EKG EKG Interpretation  Date/Time:  Friday February 18 2018 14:03:43 EST Ventricular Rate:  70 PR Interval:    QRS Duration: 110 QT Interval:  382 QTC Calculation: 413 R Axis:   91 Text Interpretation:  Sinus rhythm Borderline right axis deviation since last tracing no significant change Confirmed by Rolan Bucco [phone]) on 02/18/2018 3:23:54 PM   Radiology Dg Chest 2 View  Result Date: 02/18/2018 CLINICAL DATA:  Chest pain EXAM: CHEST - 2 VIEW COMPARISON:  August 25, 2017 FINDINGS: Lungs are clear. Heart size and pulmonary vascularity are normal. No adenopathy. No bone lesions. No pneumothorax. IMPRESSION: No edema or consolidation. Electronically Signed   By: Bretta Bang III M.D.   On: 02/18/2018 14:40    Procedures Procedures (including critical care time)  Medications Ordered in ED Medications  albuterol (PROVENTIL HFA;VENTOLIN HFA) 108 (90 Base) MCG/ACT inhaler 2 puff (has no administration in time range)  AEROCHAMBER PLUS FLO-VU  MEDIUM MISC 1 each (has no administration in time range)  ipratropium-albuterol (DUONEB) 0.5-2.5 (3) MG/3ML nebulizer solution 3 mL (3 mLs Nebulization Given 02/18/18 1533)     Initial Impression / Assessment and Plan / ED Course  I have reviewed the triage vital signs and the nursing notes.  Pertinent labs & imaging results that were available during my care of the patient were reviewed by me and considered in my medical decision making (see chart for details).  Clinical Course as of Feb 19 1607  Fri Feb 18, 2018  1555 Patient reevaluated, says he feels like his chest congestion is breaking up after DuoNeb treatment and wishes for discharge at this time.   [EH]    Clinical Course User Index [EH] Cristina Gong, PA-C   Patient is to be discharged with recommendation to follow up with PCP in regards to today's hospital visit. Chest pain is not likely of cardiac or pulmonary etiology d/t presentation, PERC negative, VSS, no tracheal deviation, no JVD or new murmur, RRR, breath sounds equal bilaterally, EKG without acute abnormalities, negative troponin, and negative CXR. Pt has been advised to return to the ED if CP becomes exertional, associated with diaphoresis or nausea, radiates to left jaw/arm, worsens or becomes concerning in any way. Pt appears reliable for follow up and is agreeable to discharge.     Suspect that his symptoms are related to musculoskeletal discomfort from coughing related to seasonal allergies.  He has previously been treated with albuterol for seasonal allergies, he was given DuoNeb treatment after which he felt like he was improved.  He will be discharged with albuterol inhaler, spacing chamber, prescriptions for additional albuterol inhalers, and instructions to start OTC allergy medicines including nasal steroid spray and second-generation p.o. antihistamines.  Return precautions were discussed with patient who  states their understanding.  At the time of  discharge patient denied any unaddressed complaints or concerns.  Patient is agreeable for discharge home.   Final Clinical Impressions(s) / ED Diagnoses   Final diagnoses:  Chest pain, unspecified type  Asthma due to seasonal allergies  Cough    ED Discharge Orders         Ordered    albuterol (PROVENTIL HFA;VENTOLIN HFA) 108 (90 Base) MCG/ACT inhaler  Every 4 hours PRN     02/18/18 1559           Cristina Gong, PA-C 02/18/18 1611    Rolan Bucco, MD 02/19/18 1207

## 2018-02-18 NOTE — ED Notes (Signed)
Pt on monitor 

## 2018-02-18 NOTE — Discharge Instructions (Addendum)
Please consider taking a daily allergy medication to help with your symptoms.  Please use a nasal steroid spray such as Nasacort or Flonase.  I suggest a less drowsy 24 hour medication such as allegra, zyrtec or Claritin or the generic version in addition.

## 2018-07-25 ENCOUNTER — Other Ambulatory Visit: Payer: Self-pay

## 2018-07-25 ENCOUNTER — Encounter (HOSPITAL_BASED_OUTPATIENT_CLINIC_OR_DEPARTMENT_OTHER): Payer: Self-pay

## 2018-07-25 ENCOUNTER — Emergency Department (HOSPITAL_BASED_OUTPATIENT_CLINIC_OR_DEPARTMENT_OTHER): Payer: Medicaid Other

## 2018-07-25 ENCOUNTER — Emergency Department (HOSPITAL_BASED_OUTPATIENT_CLINIC_OR_DEPARTMENT_OTHER)
Admission: EM | Admit: 2018-07-25 | Discharge: 2018-07-25 | Disposition: A | Discharge status: Home / Self Care | Payer: Medicaid Other | Attending: Emergency Medicine | Admitting: Emergency Medicine

## 2018-07-25 DIAGNOSIS — R0789 Other chest pain: Secondary | ICD-10-CM | POA: Diagnosis present

## 2018-07-25 DIAGNOSIS — Z87891 Personal history of nicotine dependence: Secondary | ICD-10-CM | POA: Diagnosis not present

## 2018-07-25 DIAGNOSIS — K2100 Gastro-esophageal reflux disease with esophagitis, without bleeding: Secondary | ICD-10-CM

## 2018-07-25 DIAGNOSIS — I10 Essential (primary) hypertension: Secondary | ICD-10-CM | POA: Insufficient documentation

## 2018-07-25 DIAGNOSIS — R110 Nausea: Secondary | ICD-10-CM | POA: Diagnosis not present

## 2018-07-25 DIAGNOSIS — K5900 Constipation, unspecified: Secondary | ICD-10-CM | POA: Diagnosis not present

## 2018-07-25 DIAGNOSIS — Z79899 Other long term (current) drug therapy: Secondary | ICD-10-CM | POA: Insufficient documentation

## 2018-07-25 LAB — HEPATIC FUNCTION PANEL
ALT: 66 U/L — ABNORMAL HIGH (ref 0–44)
AST: 24 U/L (ref 15–41)
Albumin: 3.8 g/dL (ref 3.5–5.0)
Alkaline Phosphatase: 69 U/L (ref 38–126)
Bilirubin, Direct: 0.1 mg/dL (ref 0.0–0.2)
Indirect Bilirubin: 0.6 mg/dL (ref 0.3–0.9)
Total Bilirubin: 0.7 mg/dL (ref 0.3–1.2)
Total Protein: 7 g/dL (ref 6.5–8.1)

## 2018-07-25 LAB — BASIC METABOLIC PANEL
Anion gap: 11 (ref 5–15)
BUN: 24 mg/dL — ABNORMAL HIGH (ref 6–20)
CO2: 26 mmol/L (ref 22–32)
Calcium: 8.9 mg/dL (ref 8.9–10.3)
Chloride: 99 mmol/L (ref 98–111)
Creatinine, Ser: 1.55 mg/dL — ABNORMAL HIGH (ref 0.61–1.24)
GFR calc Af Amer: >60 mL/min (ref >60)
GFR calc non Af Amer: 56 mL/min — ABNORMAL LOW (ref >60)
Glucose, Bld: 85 mg/dL (ref 70–99)
Potassium: 3.3 mmol/L — ABNORMAL LOW (ref 3.5–5.1)
Sodium: 136 mmol/L (ref 135–145)

## 2018-07-25 LAB — CBC
HCT: 45.9 % (ref 39.0–52.0)
Hemoglobin: 14.8 g/dL (ref 13.0–17.0)
MCH: 25.7 pg — ABNORMAL LOW (ref 26.0–34.0)
MCHC: 32.2 g/dL (ref 30.0–36.0)
MCV: 79.8 fL — ABNORMAL LOW (ref 80.0–100.0)
Platelets: 309 10*3/uL (ref 150–400)
RBC: 5.75 MIL/uL (ref 4.22–5.81)
RDW: 14 % (ref 11.5–15.5)
WBC: 11.3 10*3/uL — ABNORMAL HIGH (ref 4.0–10.5)
nRBC: 0.2 % (ref 0.0–0.2)

## 2018-07-25 LAB — LIPASE, BLOOD: Lipase: 27 U/L (ref 11–51)

## 2018-07-25 LAB — TROPONIN I (HIGH SENSITIVITY): Troponin I (High Sensitivity): 8 ng/L (ref <18)

## 2018-07-25 MED ORDER — SUCRALFATE 1 G PO TABS: 1.0000 g | ORAL_TABLET | Freq: Three times a day (TID) | ORAL | 0 refills | Status: DC

## 2018-07-25 MED ORDER — LIDOCAINE VISCOUS HCL 2 % MT SOLN
15.0000 mL | Freq: Once | OROMUCOSAL | Status: AC
  Administered 2018-07-25: 15 mL via ORAL
  Filled 2018-07-25: qty 15

## 2018-07-25 MED ORDER — SODIUM CHLORIDE 0.9% FLUSH
3.0000 mL | Freq: Once | INTRAVENOUS | Status: DC
  Filled 2018-07-25: qty 3

## 2018-07-25 MED ORDER — ALUM & MAG HYDROXIDE-SIMETH 200-200-20 MG/5ML PO SUSP
30.0000 mL | Freq: Once | ORAL | Status: AC
  Administered 2018-07-25: 30 mL via ORAL
  Filled 2018-07-25: qty 30

## 2018-07-25 MED ORDER — ONDANSETRON HCL 4 MG/2ML IJ SOLN
4.0000 mg | Freq: Once | INTRAMUSCULAR | Status: AC
  Administered 2018-07-25: 4 mg via INTRAVENOUS
  Filled 2018-07-25: qty 2

## 2018-07-25 MED ORDER — PANTOPRAZOLE SODIUM 40 MG IV SOLR
40.0000 mg | Freq: Once | INTRAVENOUS | Status: AC
  Administered 2018-07-25: 40 mg via INTRAVENOUS
  Filled 2018-07-25: qty 40

## 2018-07-25 MED ORDER — ONDANSETRON 4 MG PO TBDP: ORAL_TABLET | ORAL | 0 refills | Status: DC

## 2018-07-25 NOTE — ED Provider Notes (Signed)
[facility] EMERGENCY DEPARTMENT Provider Note   CSN: [id number] Arrival date & time: 07/25/18  1459    History   Chief Complaint Chief Complaint  Patient presents with  . Chest Pain    HPI Glenn Martin is a 38 y.o. male.     Patient is a 38 year old male who presents with chest pain.  He states he has a history of irritable bowel syndrome and GERD.  He states he frequently has indigestion but over the last 4 days has been worse.  He has burning up into his chest and sharp pains in the center of his chest.  He denies any shortness of breath.  He says that he feels like his food is regurgitating and he has an acid taste in his mouth.  He has had some nausea but no vomiting.  No change in bowel habits.  He does have some constipation.  No known fevers.  He says that chest pain today is been constant since this morning although it has gone away at this point.  He tried using Pepto-Bismol and Tums without improvement in symptoms.  He is chronically on omeprazole and laxatives for his IBS.  He has a gastroenterologist in [place].     Past Medical History:  Diagnosis Date  . High cholesterol   . Hypertension   . IBS (irritable bowel syndrome)   . Prediabetes   . Seasonal allergies     There are no active problems to display for this patient.   Past Surgical History:  Procedure Laterality Date  . HIP SURGERY Bilateral   . SHOULDER SURGERY          Home Medications    Prior to Admission medications   Medication Sig Start Date End Date Taking? Authorizing Provider  albuterol (PROVENTIL HFA;VENTOLIN HFA) 108 (90 Base) MCG/ACT inhaler Inhale 2 puffs into the lungs every 4 (four) hours as needed for wheezing or shortness of breath. 02/18/18   Lorin Glass, PA-C  amLODipine (NORVASC) 5 MG tablet Take 5 mg by mouth daily.    [provider]  atenolol (TENORMIN) 25 MG tablet Take 50 mg by mouth daily. 07/08/17   [provider]  cetirizine  (ZYRTEC) 10 MG tablet Take 10 mg by mouth daily.    [provider]  chlorthalidone (HYGROTON) 50 MG tablet Take 50 mg by mouth daily. 07/05/17   [provider]  ezetimibe (ZETIA) 10 MG tablet Take by mouth. 11/12/17   [provider]  gabapentin (NEURONTIN) 300 MG capsule 2 caps in morning, 2 caps at bedtime. 11/18/17   [provider]  glycopyrrolate (ROBINUL) 2 MG tablet Take 2 mg by mouth daily.    [provider]  linaclotide (LINZESS) 72 MCG capsule Take 72 mcg by mouth daily before breakfast.    [provider]  omeprazole (PRILOSEC) 40 MG capsule Take 40 mg by mouth 2 (two) times daily.    [provider]  ondansetron (ZOFRAN ODT) 4 MG disintegrating tablet 4mg  ODT q4 hours prn nausea/vomit 07/25/18   Malvin Johns, MD  sucralfate (CARAFATE) 1 g tablet Take 1 tablet (1 g total) by mouth 4 (four) times daily -  with meals and at bedtime. 07/25/18   Malvin Johns, MD  vitamin C (ASCORBIC ACID) 500 MG tablet Take 500 mg by mouth daily.    [provider]  Vitamin D, Ergocalciferol, (DRISDOL) 50000 units CAPS capsule Take 50,000 Units by mouth every 7 (seven) days. On mondays  [provider]    Family History No family history on file.  Social History Social History   Tobacco Use  . Smoking status: Former Smoker    Packs/day: 0.00    Types: Cigarettes  . Smokeless tobacco: Never Used  . Tobacco comment: quit 2 weeks ago as of 02/24/18  Substance Use Topics  . Alcohol use: Yes    Comment: occ  . Drug use: No     Allergies   Atorvastatin   Review of Systems Review of Systems  Constitutional: Negative for chills, diaphoresis, fatigue and fever.  HENT: Negative for congestion, rhinorrhea and sneezing.   Eyes: Negative.   Respiratory: Negative for cough, chest tightness and shortness of breath.   Cardiovascular: Positive for chest pain. Negative for leg swelling.  Gastrointestinal: Positive for  abdominal distention and nausea. Negative for abdominal pain, blood in stool, diarrhea and vomiting.  Genitourinary: Negative for difficulty urinating, flank pain, frequency and hematuria.  Musculoskeletal: Negative for arthralgias and back pain.  Skin: Negative for rash.  Neurological: Negative for dizziness, speech difficulty, weakness, numbness and headaches.     Physical Exam Updated Vital Signs BP 124/78   Pulse 62   Temp 98.3 F (36.8 C) (Oral)   Resp 14   Ht 5\' 11"  (1.803 m)   Wt (!) 145.6 kg   SpO2 94%   BMI 44.77 kg/m   Physical Exam Constitutional:      Appearance: He is well-developed.  HENT:     Head: Normocephalic and atraumatic.  Eyes:     Pupils: Pupils are equal, round, and reactive to light.  Neck:     Musculoskeletal: Normal range of motion and neck supple.  Cardiovascular:     Rate and Rhythm: Normal rate and regular rhythm.     Heart sounds: Normal heart sounds.  Pulmonary:     Effort: Pulmonary effort is normal. No respiratory distress.     Breath sounds: Normal breath sounds. No wheezing or rales.  Chest:     Chest wall: No tenderness.  Abdominal:     General: Bowel sounds are normal.     Palpations: Abdomen is soft.     Tenderness: There is no abdominal tenderness. There is no guarding or rebound.  Musculoskeletal: Normal range of motion.  Lymphadenopathy:     Cervical: No cervical adenopathy.  Skin:    General: Skin is warm and dry.     Findings: No rash.  Neurological:     Mental Status: He is alert and oriented to person, place, and time.      ED Treatments / Results  Labs (all labs ordered are listed, but only abnormal results are displayed) Labs Reviewed  BASIC METABOLIC PANEL - Abnormal; Notable for the following components:      Result Value   Potassium 3.3 (*)    BUN 24 (*)    Creatinine, Ser 1.55 (*)    GFR calc non Af Amer 56 (*)    All other components within normal limits  CBC - Abnormal; Notable for the following  components:   WBC 11.3 (*)    MCV 79.8 (*)    MCH 25.7 (*)    All other components within normal limits  HEPATIC FUNCTION PANEL - Abnormal; Notable for the following components:   ALT 66 (*)    All other components within normal limits  LIPASE, BLOOD  TROPONIN I (HIGH SENSITIVITY)    EKG EKG Interpretation  Date/Time:  Monday July 25 2018 15:07:48 EDT  Ventricular Rate:  74 PR Interval:    QRS Duration: 106 QT Interval:  361 QTC Calculation: 401 R Axis:   61 Text Interpretation:  Sinus rhythm since last tracing no significant change Confirmed by Rolan BuccoBelfi, Kennedy Bohanon [phone]([id number]) on 07/25/2018 3:52:52 PM   Radiology Dg Chest 2 View  Result Date: 07/25/2018 CLINICAL DATA:  Chest pain. EXAM: CHEST - 2 VIEW COMPARISON:  February 18, 2018 FINDINGS: The heart size and mediastinal contours are within normal limits. Both lungs are clear. The visualized skeletal structures are unremarkable. IMPRESSION: No active cardiopulmonary disease. Electronically Signed   By: Gerome Samavid  Williams III M.D   On: 07/25/2018 15:54    Procedures Procedures (including critical care time)  Medications Ordered in ED Medications  sodium chloride flush (NS) 0.9 % injection 3 mL (3 mLs Intravenous Not Given 07/25/18 1516)  pantoprazole (PROTONIX) injection 40 mg (40 mg Intravenous Given 07/25/18 1646)  ondansetron (ZOFRAN) injection 4 mg (4 mg Intravenous Given 07/25/18 1636)  alum & mag hydroxide-simeth (MAALOX/MYLANTA) 200-200-20 MG/5ML suspension 30 mL (30 mLs Oral Given 07/25/18 1634)    And  lidocaine (XYLOCAINE) 2 % viscous mouth solution 15 mL (15 mLs Oral Given 07/25/18 1634)     Initial Impression / Assessment and Plan / ED Course  I have reviewed the triage vital signs and the nursing notes.  Pertinent labs & imaging results that were available during my care of the patient were reviewed by me and considered in my medical decision making (see chart for details).        Patient is a 38 year old male who  presents with chest pain.  It sounds consistent with GERD.  He has no associated symptoms.  No exertional symptoms.  His EKG is non-concerning without ischemic changes.  His troponin is negative.  I did not feel he need to have a repeat troponin given that it has been going on for several hours.  He says he has a history of GERD and has burning sensation in his mouth which is consistent with his GERD.  His chest x-ray is clear without evidence of pneumonia or free air.  His labs are non-concerning.  His creatinine is slightly more elevated than his prior values.  He was hydrated.  He was given Protonix and a GI cocktail.  He is feeling much better after this.  His abdominal exam is benign.  He has no evidence of pancreatitis.  His LFTs are non-concerning.  He was discharged home in good condition.  He was encouraged to follow-up with his gastroenterologist.  Return precautions were given.  Final Clinical Impressions(s) / ED Diagnoses   Final diagnoses:  Atypical chest pain  Gastroesophageal reflux disease with esophagitis    ED Discharge Orders         Ordered    sucralfate (CARAFATE) 1 g tablet  3 times daily with meals & bedtime     07/25/18 1758    ondansetron (ZOFRAN ODT) 4 MG disintegrating tablet     07/25/18 1758           Rolan BuccoBelfi, Zineb Glade, MD 07/25/18 1801

## 2018-07-25 NOTE — ED Triage Notes (Addendum)
Pt c/o CP x 4 days-states feels like indigestion-NAD-steady gait

## 2019-05-29 ENCOUNTER — Encounter (HOSPITAL_BASED_OUTPATIENT_CLINIC_OR_DEPARTMENT_OTHER): Payer: Self-pay | Admitting: Emergency Medicine

## 2019-05-29 ENCOUNTER — Emergency Department (HOSPITAL_BASED_OUTPATIENT_CLINIC_OR_DEPARTMENT_OTHER): Payer: Medicaid Other

## 2019-05-29 ENCOUNTER — Emergency Department (HOSPITAL_BASED_OUTPATIENT_CLINIC_OR_DEPARTMENT_OTHER)
Admission: EM | Admit: 2019-05-29 | Discharge: 2019-05-29 | Disposition: A | Discharge status: Home / Self Care | Payer: Medicaid Other | Attending: Emergency Medicine | Admitting: Emergency Medicine

## 2019-05-29 ENCOUNTER — Other Ambulatory Visit: Payer: Self-pay

## 2019-05-29 DIAGNOSIS — R0789 Other chest pain: Secondary | ICD-10-CM | POA: Insufficient documentation

## 2019-05-29 DIAGNOSIS — Z79899 Other long term (current) drug therapy: Secondary | ICD-10-CM | POA: Insufficient documentation

## 2019-05-29 DIAGNOSIS — R7303 Prediabetes: Secondary | ICD-10-CM | POA: Insufficient documentation

## 2019-05-29 DIAGNOSIS — Z87891 Personal history of nicotine dependence: Secondary | ICD-10-CM | POA: Insufficient documentation

## 2019-05-29 DIAGNOSIS — R079 Chest pain, unspecified: Secondary | ICD-10-CM | POA: Diagnosis present

## 2019-05-29 DIAGNOSIS — I10 Essential (primary) hypertension: Secondary | ICD-10-CM | POA: Diagnosis not present

## 2019-05-29 HISTORY — DX: Gastro-esophageal reflux disease without esophagitis: K21.9

## 2019-05-29 LAB — CBC WITH DIFFERENTIAL/PLATELET
Abs Immature Granulocytes: 0.03 10*3/uL (ref 0.00–0.07)
Basophils Absolute: 0 10*3/uL (ref 0.0–0.1)
Basophils Relative: 1 %
Eosinophils Absolute: 0.2 10*3/uL (ref 0.0–0.5)
Eosinophils Relative: 3 %
HCT: 45.7 % (ref 39.0–52.0)
Hemoglobin: 14.7 g/dL (ref 13.0–17.0)
Immature Granulocytes: 1 %
Lymphocytes Relative: 25 %
Lymphs Abs: 1.3 10*3/uL (ref 0.7–4.0)
MCH: 25.9 pg — ABNORMAL LOW (ref 26.0–34.0)
MCHC: 32.2 g/dL (ref 30.0–36.0)
MCV: 80.5 fL (ref 80.0–100.0)
Monocytes Absolute: 0.6 10*3/uL (ref 0.1–1.0)
Monocytes Relative: 11 %
Neutro Abs: 3.1 10*3/uL (ref 1.7–7.7)
Neutrophils Relative %: 59 %
Platelets: 306 10*3/uL (ref 150–400)
RBC: 5.68 MIL/uL (ref 4.22–5.81)
RDW: 14.5 % (ref 11.5–15.5)
WBC: 5.3 10*3/uL (ref 4.0–10.5)
nRBC: 0 % (ref 0.0–0.2)

## 2019-05-29 LAB — COMPREHENSIVE METABOLIC PANEL
ALT: 44 U/L (ref 0–44)
AST: 25 U/L (ref 15–41)
Albumin: 4.1 g/dL (ref 3.5–5.0)
Alkaline Phosphatase: 53 U/L (ref 38–126)
Anion gap: 10 (ref 5–15)
BUN: 18 mg/dL (ref 6–20)
CO2: 28 mmol/L (ref 22–32)
Calcium: 9.5 mg/dL (ref 8.9–10.3)
Chloride: 101 mmol/L (ref 98–111)
Creatinine, Ser: 1.33 mg/dL — ABNORMAL HIGH (ref 0.61–1.24)
GFR calc Af Amer: >60 mL/min (ref >60)
GFR calc non Af Amer: >60 mL/min (ref >60)
Glucose, Bld: 97 mg/dL (ref 70–99)
Potassium: 3.9 mmol/L (ref 3.5–5.1)
Sodium: 139 mmol/L (ref 135–145)
Total Bilirubin: 0.6 mg/dL (ref 0.3–1.2)
Total Protein: 7.1 g/dL (ref 6.5–8.1)

## 2019-05-29 LAB — LIPASE, BLOOD: Lipase: 21 U/L (ref 11–51)

## 2019-05-29 LAB — TROPONIN I (HIGH SENSITIVITY)
Troponin I (High Sensitivity): 5 ng/L (ref <18)
Troponin I (High Sensitivity): 5 ng/L (ref <18)

## 2019-05-29 MED ORDER — SODIUM CHLORIDE 0.9 % IV SOLN
INTRAVENOUS | Status: DC | PRN
  Administered 2019-05-29: 250 mL via INTRAVENOUS

## 2019-05-29 MED ORDER — LIDOCAINE VISCOUS HCL 2 % MT SOLN
15.0000 mL | Freq: Once | OROMUCOSAL | Status: AC
  Administered 2019-05-29: 15 mL via OROMUCOSAL
  Filled 2019-05-29: qty 15

## 2019-05-29 MED ORDER — FAMOTIDINE IN NACL 20-0.9 MG/50ML-% IV SOLN
20.0000 mg | Freq: Once | INTRAVENOUS | Status: AC
  Administered 2019-05-29: 20 mg via INTRAVENOUS
  Filled 2019-05-29: qty 50

## 2019-05-29 MED ORDER — ALUM & MAG HYDROXIDE-SIMETH 200-200-20 MG/5ML PO SUSP
15.0000 mL | Freq: Once | ORAL | Status: AC
  Administered 2019-05-29: 15 mL via ORAL
  Filled 2019-05-29: qty 30

## 2019-05-29 NOTE — Discharge Instructions (Addendum)
Your work-up has been reassuring in the emergency department today.  I do recommend that you follow-up with your primary care doctor and further referral for cardiology if you develop any further chest pain.  Continue taking medications as prescribed.  Any worsening chest pain or new symptoms return to the ER immediately.  Continue to monitor your blood pressure at home.

## 2019-05-29 NOTE — ED Triage Notes (Signed)
CP x 2 days, with SOB. CP x 20 min presently

## 2019-05-29 NOTE — ED Notes (Signed)
Pt on monitor 

## 2019-05-29 NOTE — ED Provider Notes (Signed)
[facility] EMERGENCY DEPARTMENT Provider Note   CSN: [id number] Arrival date & time: 05/29/19  6144     History Chief Complaint  Patient presents with  . Chest Pain    Glenn Martin is a 39 y.o. male.  HPI 39 year old African-American male with a past medical history significant for hyperlipidemia, hypertension, prediabetes, IBS presents to the emergency department today for evaluation of chest pain.  Patient reports for the past 2 days he has been having intermittent chest pain.  Chest pain is retrosternal and left-sided.  Will come and go.  Patient reports that the pain will last for several seconds then resolved.  Has been present for the past 20 minutes today.  Patient reports that he has associated shortness of breath.  Chest pain is not exertional.  Denies any associated nausea, vomiting or diaphoresis.  He was unsure if this was secondary to his acid reflux.  Patient does take several blood pressure medications and reports that when he felt his chest pain today he took his blood pressure was elevated to 315 systolic.  Patient states that it usually is 400 systolic.  Patient has taken ibuprofen for pain today.  Denies any history of PE/DVT, prolonged immobilization, recent hospitalization/surgeries, unilateral leg swelling or calf tenderness, hemoptysis.  Patient does report daily tobacco use.  Denies any family history of cardiac disease.  Patient rates his pain currently a 3/10.    Past Medical History:  Diagnosis Date  . High cholesterol   . Hypertension   . IBS (irritable bowel syndrome)   . Prediabetes   . Seasonal allergies     There are no problems to display for this patient.   Past Surgical History:  Procedure Laterality Date  . HIP SURGERY Bilateral   . SHOULDER SURGERY         No family history on file.  Social History   Tobacco Use  . Smoking status: Former Smoker    Packs/day: 0.00    Types: Cigarettes  . Smokeless tobacco: Never Used  .  Tobacco comment: quit 2 weeks ago as of 02/24/18  Substance Use Topics  . Alcohol use: Yes    Comment: occ  . Drug use: No    Home Medications Prior to Admission medications   Medication Sig Start Date End Date Taking? Authorizing Provider  albuterol (PROVENTIL HFA;VENTOLIN HFA) 108 (90 Base) MCG/ACT inhaler Inhale 2 puffs into the lungs every 4 (four) hours as needed for wheezing or shortness of breath. 02/18/18   Lorin Glass, PA-C  amLODipine (NORVASC) 5 MG tablet Take 5 mg by mouth daily.    [provider]  atenolol (TENORMIN) 25 MG tablet Take 50 mg by mouth daily. 07/08/17   [provider]  cetirizine (ZYRTEC) 10 MG tablet Take 10 mg by mouth daily.    [provider]  chlorthalidone (HYGROTON) 50 MG tablet Take 50 mg by mouth daily. 07/05/17   [provider]  ezetimibe (ZETIA) 10 MG tablet Take by mouth. 11/12/17   [provider]  gabapentin (NEURONTIN) 300 MG capsule 2 caps in morning, 2 caps at bedtime. 11/18/17   [provider]  glycopyrrolate (ROBINUL) 2 MG tablet Take 2 mg by mouth daily.    [provider]  linaclotide (LINZESS) 72 MCG capsule Take 72 mcg by mouth daily before breakfast.    [provider]  omeprazole (PRILOSEC) 40 MG capsule Take 40 mg by mouth 2 (two) times daily.    [provider]  ondansetron (ZOFRAN ODT) 4 MG disintegrating tablet 4mg  ODT q4 hours prn nausea/vomit 07/25/18   07/27/18, MD  sucralfate (CARAFATE) 1 g tablet Take 1 tablet (1 g total) by mouth 4 (four) times daily -  with meals and at bedtime. 07/25/18   07/27/18, MD  vitamin C (ASCORBIC ACID) 500 MG tablet Take 500 mg by mouth daily.    [provider]  Vitamin D, Ergocalciferol, (DRISDOL) 50000 units CAPS capsule Take 50,000 Units by mouth every 7 (seven) days. On mondays    [provider]    Allergies    Atorvastatin  Review of Systems   Review of Systems  Constitutional:  Negative for chills, diaphoresis and fever.  HENT: Negative for congestion.   Eyes: Negative for visual disturbance.  Respiratory: Positive for shortness of breath. Negative for cough.   Cardiovascular: Positive for chest pain. Negative for palpitations and leg swelling.  Gastrointestinal: Negative for abdominal pain, diarrhea, nausea and vomiting.  Genitourinary: Negative for dysuria, flank pain, frequency, hematuria and urgency.  Musculoskeletal: Negative for arthralgias and myalgias.  Skin: Negative for rash.  Neurological: Positive for dizziness. Negative for headaches.  Psychiatric/Behavioral: Negative for sleep disturbance. The patient is not nervous/anxious.     Physical Exam Updated Vital Signs BP 135/82 (BP Location: Right Arm)   Pulse 70   Temp 98.6 F (37 C) (Oral)   Resp 18   Ht 5\' 11"  (1.803 m)   Wt (!) 149.3 kg   SpO2 96%   BMI 45.90 kg/m   Physical Exam Vitals and nursing note reviewed.  Constitutional:      General: He is not in acute distress.    Appearance: He is well-developed. He is obese. He is not ill-appearing, toxic-appearing or diaphoretic.  HENT:     Head: Normocephalic and atraumatic.     Nose: Nose normal.  Eyes:     General:        Right eye: No discharge.        Left eye: No discharge.     Conjunctiva/sclera: Conjunctivae normal.     Pupils: Pupils are equal, round, and reactive to light.  Neck:     Vascular: No JVD.     Trachea: No tracheal deviation.  Cardiovascular:     Rate and Rhythm: Normal rate and regular rhythm.     Pulses:          Radial pulses are 2+ on the right side and 2+ on the left side.       Dorsalis pedis pulses are 2+ on the right side and 2+ on the left side.     Heart sounds: Normal heart sounds. No murmur. No friction rub. No gallop.   Pulmonary:     Effort: Pulmonary effort is normal. No respiratory distress.     Breath sounds: Normal breath sounds. No decreased breath sounds, wheezing, rhonchi or rales.   Chest:     Chest wall: No tenderness.  Abdominal:     General: Bowel sounds are normal. There is no distension.     Palpations: Abdomen is soft.     Tenderness: There is no abdominal tenderness. There is no guarding or rebound.  Musculoskeletal:        General: Normal range of motion.     Cervical back: Normal range of motion and neck supple.     Right lower leg: No tenderness. No edema.     Left lower leg: No tenderness. No edema.     Comments:  No lower extremity edema or calf tenderness.  Lymphadenopathy:     Cervical: No cervical adenopathy.  Skin:    General: Skin is warm and dry.     Capillary Refill: Capillary refill takes less than 2 seconds.  Neurological:     Mental Status: He is alert and oriented to person, place, and time.     Sensory: Sensation is intact.     Motor: No weakness.  Psychiatric:        Mood and Affect: Mood normal.        Behavior: Behavior normal.        Thought Content: Thought content normal.        Judgment: Judgment normal.     ED Results / Procedures / Treatments   Labs (all labs ordered are listed, but only abnormal results are displayed) Labs Reviewed  CBC WITH DIFFERENTIAL/PLATELET - Abnormal; Notable for the following components:      Result Value   MCH 25.9 (*)    All other components within normal limits  COMPREHENSIVE METABOLIC PANEL - Abnormal; Notable for the following components:   Creatinine, Ser 1.33 (*)    All other components within normal limits  LIPASE, BLOOD  TROPONIN I (HIGH SENSITIVITY)  TROPONIN I (HIGH SENSITIVITY)    EKG EKG Interpretation  Date/Time:  Monday May 29 2019 09:32:25 EDT Ventricular Rate:  72 PR Interval:    QRS Duration: 108 QT Interval:  370 QTC Calculation: 405 R Axis:   92 Text Interpretation: Sinus rhythm Borderline right axis deviation Borderline T abnormalities, inferior leads Confirmed by Raeford Razor [phone]) on 05/29/2019 10:14:17 AM   Radiology DG Chest 2 View  Result Date:  05/29/2019 CLINICAL DATA:  Chest pain and shortness of breath for 2 days EXAM: CHEST - 2 VIEW COMPARISON:  11/04/2018 FINDINGS: The heart size and mediastinal contours are within normal limits. Both lungs are clear. The visualized skeletal structures are unremarkable. IMPRESSION: No active cardiopulmonary disease. Electronically Signed   By: Alcide Clever M.D.   On: 05/29/2019 09:57    Procedures Procedures (including critical care time)  Medications Ordered in ED Medications  0.9 %  sodium chloride infusion ( Intravenous Stopped 05/29/19 1208)  famotidine (PEPCID) IVPB 20 mg premix (0 mg Intravenous Stopped 05/29/19 1048)  lidocaine (XYLOCAINE) 2 % viscous mouth solution 15 mL (15 mLs Mouth/Throat Given 05/29/19 1006)  alum & mag hydroxide-simeth (MAALOX/MYLANTA) 200-200-20 MG/5ML suspension 15 mL (15 mLs Oral Given 05/29/19 1005)    ED Course  I have reviewed the triage vital signs and the nursing notes.  Pertinent labs & imaging results that were available during my care of the patient were reviewed by me and considered in my medical decision making (see chart for details).    MDM Rules/Calculators/A&P                      Pt presents to the Ed today with complaints of cp. Patient is to be discharged with recommendation to follow up with PCP in regards to today's hospital visit. Chest pain is not likely of cardiac or pulmonary etiology d/t presentation, perc negative negative, VSS, no tracheal deviation, no JVD or new murmur, RRR, breath sounds equal bilaterally, EKG without any change from prior tracing and shows no signs of ischemia, negative delta troponin, and negative CXR. Hear score is 3.  Labs reviewed.  Creatinine is improved from baseline.  Pain has been completely resolved in the ER with GI cocktail.  Blood pressure  has normalized as well.  Doubt pericarditis, myocarditis, dissection, PE, ACS.  Pt has been advised to return to the ED is CP becomes exertional, associated with  diaphoresis or nausea, radiates to left jaw/arm, worsens or becomes concerning in any way. Pt appears reliable for follow up and is agreeable to discharge.   .    Final Clinical Impression(s) / ED Diagnoses Final diagnoses:  Atypical chest pain    Rx / DC Orders ED Discharge Orders    None       Wallace Keller 05/29/19 1228    Raeford Razor, MD 05/29/19 1335

## 2019-06-23 ENCOUNTER — Other Ambulatory Visit: Payer: Self-pay

## 2019-06-23 ENCOUNTER — Emergency Department (HOSPITAL_BASED_OUTPATIENT_CLINIC_OR_DEPARTMENT_OTHER)
Admission: EM | Admit: 2019-06-23 | Discharge: 2019-06-23 | Disposition: A | Discharge status: Home / Self Care | Payer: Medicaid Other | Attending: Emergency Medicine | Admitting: Emergency Medicine

## 2019-06-23 ENCOUNTER — Emergency Department (HOSPITAL_BASED_OUTPATIENT_CLINIC_OR_DEPARTMENT_OTHER): Payer: Medicaid Other

## 2019-06-23 ENCOUNTER — Encounter (HOSPITAL_BASED_OUTPATIENT_CLINIC_OR_DEPARTMENT_OTHER): Payer: Self-pay | Admitting: Emergency Medicine

## 2019-06-23 DIAGNOSIS — Z87891 Personal history of nicotine dependence: Secondary | ICD-10-CM | POA: Diagnosis not present

## 2019-06-23 DIAGNOSIS — Z79899 Other long term (current) drug therapy: Secondary | ICD-10-CM | POA: Diagnosis not present

## 2019-06-23 DIAGNOSIS — M25512 Pain in left shoulder: Secondary | ICD-10-CM | POA: Insufficient documentation

## 2019-06-23 DIAGNOSIS — E119 Type 2 diabetes mellitus without complications: Secondary | ICD-10-CM | POA: Diagnosis not present

## 2019-06-23 DIAGNOSIS — I10 Essential (primary) hypertension: Secondary | ICD-10-CM | POA: Diagnosis not present

## 2019-06-23 HISTORY — DX: Obesity, unspecified: E66.9

## 2019-06-23 MED ORDER — HYDROCODONE-ACETAMINOPHEN 5-325 MG PO TABS: 1.0000 | ORAL_TABLET | Freq: Four times a day (QID) | ORAL | 0 refills | Status: DC | PRN

## 2019-06-23 NOTE — ED Triage Notes (Signed)
Sudden "pop" and pain in left shoulder when lifting up to grab the lid to the grill.  Pt has already been seeing ortho for this shoulder and they are talking about doing an MRI, injection and PT.  But that was before this injury and pt sts he is unable to move his arm now due to the pain.

## 2019-06-23 NOTE — Discharge Instructions (Addendum)
You have been seen today for left shoulder pain.  Please read and follow all provided instructions. Return to the emergency room for worsening condition or new concerning symptoms.    X-ray did not show any broken bones or dislocations.  1. Medications:  Prescription to your pharmacy for Norco.  This is a pain medicine.  It make you drowsy so do not take while working or driving.    You can also take ibuprofen as needed for pain.  Take as directed on the bottle.  Continue usual home medications Take medications as prescribed. Please review all of the medicines and only take them if you do not have an allergy to them.   2. Treatment: Wear the sling for comfort.  I included range of motion exercises as we discussed.  Make sure you do not wear the sling 24/7 to avoid getting frozen shoulder. -Apply heat or ice for comfort  3. Follow Up:  Please follow up with orthopedist as soon as possible for further evaluation   ?

## 2019-06-23 NOTE — ED Provider Notes (Signed)
[facility] EMERGENCY DEPARTMENT Provider Note   CSN: [id number] Arrival date & time: 06/23/19  1115     History Chief Complaint  Patient presents with  . Shoulder Pain    Glenn Martin is a 39 y.o. right hand dominant male with past medical history significant for GERD, hyperlipidemia, hypertension, pre-diabetes, right shoulder injury with rotator cuff repair in 2017 by orthopedist in [place].  HPI Patient presents to emergency department today with chief complaint of acute worsening of left shoulder pain. Last night patient was using his right hand to close the grill when the pain started.  He is reporting a constant dull pain in his left shoulder that radiates down his bicep.  He has sharp pain with movement.  He rates the pain 8/10 in severity.  He took Tylenol and ibuprofen with minimal symptom relief. He also reports swelling to left shoulder and decreased range of motion worse that what it was previously. He denies any fever, chills, numbness, tingling, decreased sensation.  He tells me he has had x 6 months of left shoulder pain without any specific injury.  He is followed by an orthopedist in the [facility] system.  He had an appointment with Ortho last week and there was discussion of possibly proceeding with MRI or injections for his pain.    Past Medical History:  Diagnosis Date  . GERD (gastroesophageal reflux disease)   . High cholesterol   . Hypertension   . IBS (irritable bowel syndrome)   . Obesity   . Prediabetes   . Seasonal allergies     There are no problems to display for this patient.   Past Surgical History:  Procedure Laterality Date  . HIP SURGERY Bilateral   . SHOULDER SURGERY         No family history on file.  Social History   Tobacco Use  . Smoking status: Former Smoker    Packs/day: 0.00  . Smokeless tobacco: Never Used  . Tobacco comment: 1 pack/week  Vaping Use  . Vaping Use: Never used  Substance Use Topics  . Alcohol  use: Not Currently    Comment: occ  . Drug use: No    Home Medications Prior to Admission medications   Medication Sig Start Date End Date Taking? Authorizing Provider  albuterol (PROVENTIL HFA;VENTOLIN HFA) 108 (90 Base) MCG/ACT inhaler Inhale 2 puffs into the lungs every 4 (four) hours as needed for wheezing or shortness of breath. 02/18/18   Cristina Gong, PA-C  amLODipine (NORVASC) 5 MG tablet Take 10 mg by mouth daily.     [provider]  amoxicillin-clavulanate (AUGMENTIN) 875-125 MG tablet Take 1 tablet by mouth 2 (two) times daily. 06/18/19   [provider]  atenolol (TENORMIN) 25 MG tablet Take 25 mg by mouth daily.  07/08/17   [provider]  cetirizine (ZYRTEC) 10 MG tablet Take 10 mg by mouth daily.    [provider]  chlorthalidone (HYGROTON) 50 MG tablet Take 50 mg by mouth daily. 07/05/17   [provider]  diclofenac (VOLTAREN) 75 MG EC tablet Take 75 mg by mouth 2 (two) times daily. 05/19/19   [provider]  ezetimibe (ZETIA) 10 MG tablet Take by mouth. 11/12/17   [provider]  gabapentin (NEURONTIN) 300 MG capsule 2 caps in morning, 2 caps at bedtime. 11/18/17   [provider]  glycopyrrolate (ROBINUL) 2 MG tablet Take 2 mg by mouth daily.    [provider]  HYDROcodone-acetaminophen (  NORCO/VICODIN) 5-325 MG tablet Take 1 tablet by mouth every 6 (six) hours as needed. 06/23/19   Tristian Bouska, Caroleen Hamman, PA-C  linaclotide (LINZESS) 72 MCG capsule Take 72 mcg by mouth daily before breakfast.    [provider]  omeprazole (PRILOSEC) 40 MG capsule Take 40 mg by mouth 2 (two) times daily.    [provider]  ondansetron (ZOFRAN ODT) 4 MG disintegrating tablet 4mg  ODT q4 hours prn nausea/vomit 07/25/18   07/27/18, MD  pantoprazole (PROTONIX) 40 MG tablet Take by mouth. 07/26/18   [provider]  sucralfate (CARAFATE) 1 g tablet Take 1 tablet (1 g total) by mouth 4  (four) times daily -  with meals and at bedtime. 07/25/18   07/27/18, MD  vitamin C (ASCORBIC ACID) 500 MG tablet Take 500 mg by mouth daily.    [provider]  Vitamin D, Ergocalciferol, (DRISDOL) 50000 units CAPS capsule Take 50,000 Units by mouth every 7 (seven) days. On mondays    [provider]    Allergies    Atorvastatin and Shellfish allergy  Review of Systems   Review of Systems All other systems are reviewed and are negative for acute change except as noted in the HPI.  Physical Exam Updated Vital Signs BP 137/79 (BP Location: Right Arm)   Pulse 70   Temp 97.7 F (36.5 C) (Oral)   Resp 16   Ht 5\' 11"  (1.803 m)   Wt (!) 150.7 kg   SpO2 96%   BMI 46.35 kg/m   Physical Exam Vitals and nursing note reviewed.  Constitutional:      Appearance: He is well-developed. He is not ill-appearing or toxic-appearing.  HENT:     Head: Normocephalic and atraumatic.     Nose: Nose normal.  Eyes:     General: No scleral icterus.       Right eye: No discharge.        Left eye: No discharge.     Conjunctiva/sclera: Conjunctivae normal.  Neck:     Vascular: No JVD.     Comments: Full ROM intact without spinous process TTP. No bony stepoffs or deformities, no paraspinous muscle TTP or muscle spasms. No rigidity or meningeal signs. No bruising, erythema, or swelling.  Cardiovascular:     Rate and Rhythm: Normal rate and regular rhythm.     Pulses: Normal pulses.          Radial pulses are 2+ on the right side and 2+ on the left side.     Heart sounds: Normal heart sounds.  Pulmonary:     Effort: Pulmonary effort is normal.     Breath sounds: Normal breath sounds.  Abdominal:     General: There is no distension.  Musculoskeletal:     Cervical back: Normal range of motion.     Comments: No obvious deformity of left shoulder. There is mild anterior swelling of shoulder. No skin changes. Unable to perform passive ROM secondary to pain. No tenderness to  palpation of bicep or tricep.  Does have tenderness palpation of left trapezius.  Neurovascularly intact distally. Compartments soft above and below affected joint.    Full ROM left elbow and wrist.   Skin:    General: Skin is warm and dry.     Capillary Refill: Capillary refill takes less than 2 seconds.     Comments: Equal tactile temperature to bilateral upper extremities.  Neurological:     Mental Status: He is oriented to person, place,  and time.     GCS: GCS eye subscore is 4. GCS verbal subscore is 5. GCS motor subscore is 6.     Comments: Fluent speech, no facial droop.  Strong and equal grip strength in bilateral upper extremities.  Psychiatric:        Behavior: Behavior normal.     ED Results / Procedures / Treatments   Labs (all labs ordered are listed, but only abnormal results are displayed) Labs Reviewed - No data to display  EKG None  Radiology DG Shoulder Left  Result Date: 06/23/2019 CLINICAL DATA:  Pain. EXAM: LEFT SHOULDER - 2+ VIEW COMPARISON:  None. FINDINGS: There is no evidence of fracture or dislocation. There is no evidence of arthropathy or other focal bone abnormality. Soft tissues are unremarkable. IMPRESSION: Negative. Electronically Signed   By: Dorise Bullion III M.D   On: 06/23/2019 13:24    Procedures Procedures (including critical care time)  Medications Ordered in ED Medications - No data to display  ED Course  I have reviewed the triage vital signs and the nursing notes.  Pertinent labs & imaging results that were available during my care of the patient were reviewed by me and considered in my medical decision making (see chart for details).    MDM Rules/Calculators/A&P                          History provided by patient with additional history obtained from chart review.   Patient presents to the ED with complaints of pain to the left shoulder s/p after closing grill yesterday. He has chronic shoulder pain that is currently  being worked up by [place] exam without obvious deformity or open wounds. Decreased ROM of left shoulder secondary to pain. Tender to palpation over left trapezium muscle. NVI distally. Xray viewed by me is negative for fracture/dislocation. Shoulder immobilizer provided. I have reviewed the PDMP during this encounter.  Patient had recent Norco prescription for a dental infection that he has finished.  Will discharge with short course of Norco again for this shoulder pain.  He does not appear that he frequently has narcotic prescriptions.  Also recommend ibuprofen for pain. I discussed results, treatment plan, need for follow-up, and return precautions with the patient. Provided opportunity for questions, patient confirmed understanding and are in agreement with plan.     Portions of this note were generated with [employer]. Dictation errors may occur despite best attempts at proofreading.   Final Clinical Impression(s) / ED Diagnoses Final diagnoses:  Acute pain of left shoulder    Rx / DC Orders ED Discharge Orders         Ordered    HYDROcodone-acetaminophen (NORCO/VICODIN) 5-325 MG tablet  Every 6 hours PRN     Discontinue  Reprint     06/23/19 1408           Lynett Brasil, Harley Hallmark, PA-C 06/23/19 1418    Fredia Sorrow, MD 06/26/19 1625

## 2019-06-30 ENCOUNTER — Emergency Department (HOSPITAL_BASED_OUTPATIENT_CLINIC_OR_DEPARTMENT_OTHER): Payer: Medicaid Other

## 2019-06-30 ENCOUNTER — Encounter (HOSPITAL_BASED_OUTPATIENT_CLINIC_OR_DEPARTMENT_OTHER): Payer: Self-pay

## 2019-06-30 ENCOUNTER — Emergency Department (HOSPITAL_BASED_OUTPATIENT_CLINIC_OR_DEPARTMENT_OTHER)
Admission: EM | Admit: 2019-06-30 | Discharge: 2019-06-30 | Disposition: A | Discharge status: Home / Self Care | Payer: Medicaid Other | Attending: Emergency Medicine | Admitting: Emergency Medicine

## 2019-06-30 ENCOUNTER — Other Ambulatory Visit: Payer: Self-pay

## 2019-06-30 DIAGNOSIS — R0789 Other chest pain: Secondary | ICD-10-CM | POA: Diagnosis present

## 2019-06-30 DIAGNOSIS — I10 Essential (primary) hypertension: Secondary | ICD-10-CM | POA: Diagnosis not present

## 2019-06-30 DIAGNOSIS — R112 Nausea with vomiting, unspecified: Secondary | ICD-10-CM | POA: Insufficient documentation

## 2019-06-30 DIAGNOSIS — R5383 Other fatigue: Secondary | ICD-10-CM | POA: Diagnosis not present

## 2019-06-30 DIAGNOSIS — R3912 Poor urinary stream: Secondary | ICD-10-CM | POA: Diagnosis not present

## 2019-06-30 DIAGNOSIS — R0602 Shortness of breath: Secondary | ICD-10-CM | POA: Insufficient documentation

## 2019-06-30 DIAGNOSIS — K5900 Constipation, unspecified: Secondary | ICD-10-CM | POA: Insufficient documentation

## 2019-06-30 DIAGNOSIS — R072 Precordial pain: Secondary | ICD-10-CM | POA: Insufficient documentation

## 2019-06-30 DIAGNOSIS — R2243 Localized swelling, mass and lump, lower limb, bilateral: Secondary | ICD-10-CM | POA: Insufficient documentation

## 2019-06-30 DIAGNOSIS — Z87891 Personal history of nicotine dependence: Secondary | ICD-10-CM | POA: Insufficient documentation

## 2019-06-30 DIAGNOSIS — R1013 Epigastric pain: Secondary | ICD-10-CM | POA: Diagnosis not present

## 2019-06-30 LAB — COMPREHENSIVE METABOLIC PANEL
ALT: 55 U/L — ABNORMAL HIGH (ref 0–44)
AST: 30 U/L (ref 15–41)
Albumin: 4.2 g/dL (ref 3.5–5.0)
Alkaline Phosphatase: 54 U/L (ref 38–126)
Anion gap: 10 (ref 5–15)
BUN: 22 mg/dL — ABNORMAL HIGH (ref 6–20)
CO2: 28 mmol/L (ref 22–32)
Calcium: 9.1 mg/dL (ref 8.9–10.3)
Chloride: 100 mmol/L (ref 98–111)
Creatinine, Ser: 1.35 mg/dL — ABNORMAL HIGH (ref 0.61–1.24)
GFR calc Af Amer: >60 mL/min (ref >60)
GFR calc non Af Amer: >60 mL/min (ref >60)
Glucose, Bld: 109 mg/dL — ABNORMAL HIGH (ref 70–99)
Potassium: 3.6 mmol/L (ref 3.5–5.1)
Sodium: 138 mmol/L (ref 135–145)
Total Bilirubin: 0.6 mg/dL (ref 0.3–1.2)
Total Protein: 6.9 g/dL (ref 6.5–8.1)

## 2019-06-30 LAB — CBC WITH DIFFERENTIAL/PLATELET
Abs Immature Granulocytes: 0.01 10*3/uL (ref 0.00–0.07)
Basophils Absolute: 0 10*3/uL (ref 0.0–0.1)
Basophils Relative: 1 %
Eosinophils Absolute: 0.2 10*3/uL (ref 0.0–0.5)
Eosinophils Relative: 4 %
HCT: 44.9 % (ref 39.0–52.0)
Hemoglobin: 14.3 g/dL (ref 13.0–17.0)
Immature Granulocytes: 0 %
Lymphocytes Relative: 31 %
Lymphs Abs: 1.7 10*3/uL (ref 0.7–4.0)
MCH: 25.8 pg — ABNORMAL LOW (ref 26.0–34.0)
MCHC: 31.8 g/dL (ref 30.0–36.0)
MCV: 80.9 fL (ref 80.0–100.0)
Monocytes Absolute: 0.6 10*3/uL (ref 0.1–1.0)
Monocytes Relative: 11 %
Neutro Abs: 2.8 10*3/uL (ref 1.7–7.7)
Neutrophils Relative %: 53 %
Platelets: 260 10*3/uL (ref 150–400)
RBC: 5.55 MIL/uL (ref 4.22–5.81)
RDW: 14.1 % (ref 11.5–15.5)
WBC: 5.3 10*3/uL (ref 4.0–10.5)
nRBC: 0 % (ref 0.0–0.2)

## 2019-06-30 LAB — URINALYSIS, ROUTINE W REFLEX MICROSCOPIC
Bilirubin Urine: NEGATIVE
Glucose, UA: NEGATIVE mg/dL
Hgb urine dipstick: NEGATIVE
Ketones, ur: NEGATIVE mg/dL
Leukocytes,Ua: NEGATIVE
Nitrite: NEGATIVE
Protein, ur: NEGATIVE mg/dL
Specific Gravity, Urine: >1.03 — ABNORMAL HIGH (ref 1.005–1.030)
pH: 5.5 (ref 5.0–8.0)

## 2019-06-30 LAB — TROPONIN I (HIGH SENSITIVITY): Troponin I (High Sensitivity): 5 ng/L (ref <18)

## 2019-06-30 LAB — BRAIN NATRIURETIC PEPTIDE: B Natriuretic Peptide: 17.8 pg/mL (ref 0.0–100.0)

## 2019-06-30 LAB — LIPASE, BLOOD: Lipase: 21 U/L (ref 11–51)

## 2019-06-30 LAB — D-DIMER, QUANTITATIVE: D-Dimer, Quant: 0.28 ug/mL-FEU (ref 0.00–0.50)

## 2019-06-30 MED ORDER — ONDANSETRON HCL 4 MG/2ML IJ SOLN
4.0000 mg | Freq: Once | INTRAMUSCULAR | Status: AC
  Administered 2019-06-30: 4 mg via INTRAVENOUS
  Filled 2019-06-30: qty 2

## 2019-06-30 MED ORDER — PROMETHAZINE HCL 25 MG RE SUPP
25.0000 mg | Freq: Four times a day (QID) | RECTAL | 0 refills | Status: DC | PRN
Start: 2019-06-30 — End: 2020-02-04

## 2019-06-30 NOTE — ED Triage Notes (Signed)
Pt arrives with c/o CP off and on X3 weeks. Pt also arrives in sling for recent shoulder injury (already having pain in that area) reports pain goes into his shoulder from his chest. C/O some dizziness, nausea, and SOB.

## 2019-06-30 NOTE — ED Provider Notes (Addendum)
[facility] EMERGENCY DEPARTMENT Provider Note   CSN: [id number] Arrival date & time: 06/30/19  1744     History Chief Complaint  Patient presents with  . Chest Pain    Glenn Martin is a 39 y.o. male.  The history is provided by the patient and medical records. No language interpreter was used.  Chest Pain Pain location:  Epigastric, substernal area and L chest Pain quality: aching, dull and pressure   Pain radiates to:  Epigastrium Pain severity:  Moderate Onset quality:  Gradual Duration:  1 week Timing:  Constant Progression:  Waxing and waning Chronicity:  Recurrent Ineffective treatments:  Antacids Associated symptoms: abdominal pain, fatigue, lower extremity edema, nausea, shortness of breath and vomiting   Associated symptoms: no altered mental status, no anxiety, no back pain, no cough, no diaphoresis, no dizziness, no fever, no headache, no near-syncope, no numbness, no palpitations and no weakness   Risk factors: hypertension and obesity        Past Medical History:  Diagnosis Date  . GERD (gastroesophageal reflux disease)   . High cholesterol   . Hypertension   . IBS (irritable bowel syndrome)   . Obesity   . Prediabetes   . Seasonal allergies     There are no problems to display for this patient.   Past Surgical History:  Procedure Laterality Date  . HIP SURGERY Bilateral   . SHOULDER SURGERY         No family history on file.  Social History   Tobacco Use  . Smoking status: Former Smoker    Packs/day: 0.00  . Smokeless tobacco: Never Used  . Tobacco comment: 1 pack/week  Vaping Use  . Vaping Use: Never used  Substance Use Topics  . Alcohol use: Not Currently    Comment: occ  . Drug use: No    Home Medications Prior to Admission medications   Medication Sig Start Date End Date Taking? Authorizing Provider  albuterol (PROVENTIL HFA;VENTOLIN HFA) 108 (90 Base) MCG/ACT inhaler Inhale 2 puffs into the lungs every 4  (four) hours as needed for wheezing or shortness of breath. 02/18/18   Cristina Gong, PA-C  amLODipine (NORVASC) 5 MG tablet Take 10 mg by mouth daily.     [provider]  amoxicillin-clavulanate (AUGMENTIN) 875-125 MG tablet Take 1 tablet by mouth 2 (two) times daily. 06/18/19   [provider]  atenolol (TENORMIN) 25 MG tablet Take 25 mg by mouth daily.  07/08/17   [provider]  cetirizine (ZYRTEC) 10 MG tablet Take 10 mg by mouth daily.    [provider]  chlorthalidone (HYGROTON) 50 MG tablet Take 50 mg by mouth daily. 07/05/17   [provider]  diclofenac (VOLTAREN) 75 MG EC tablet Take 75 mg by mouth 2 (two) times daily. 05/19/19   [provider]  ezetimibe (ZETIA) 10 MG tablet Take by mouth. 11/12/17   [provider]  gabapentin (NEURONTIN) 300 MG capsule 2 caps in morning, 2 caps at bedtime. 11/18/17   [provider]  glycopyrrolate (ROBINUL) 2 MG tablet Take 2 mg by mouth daily.    [provider]  HYDROcodone-acetaminophen (NORCO/VICODIN) 5-325 MG tablet Take 1 tablet by mouth every 6 (six) hours as needed. 06/23/19   Albrizze, Caroleen Hamman, PA-C  linaclotide (LINZESS) 72 MCG capsule Take 72 mcg by mouth daily before breakfast.    [provider]  omeprazole (PRILOSEC) 40 MG capsule Take 40 mg by mouth 2 (two) times  daily.    [provider]  ondansetron (ZOFRAN ODT) 4 MG disintegrating tablet 4mg  ODT q4 hours prn nausea/vomit 07/25/18   07/27/18, MD  pantoprazole (PROTONIX) 40 MG tablet Take by mouth. 07/26/18   [provider]  sucralfate (CARAFATE) 1 g tablet Take 1 tablet (1 g total) by mouth 4 (four) times daily -  with meals and at bedtime. 07/25/18   07/27/18, MD  vitamin C (ASCORBIC ACID) 500 MG tablet Take 500 mg by mouth daily.    [provider]  Vitamin D, Ergocalciferol, (DRISDOL) 50000 units CAPS capsule Take 50,000 Units by mouth every 7 (seven)  days. On mondays    [provider]    Allergies    Atorvastatin, Other, and Shellfish allergy  Review of Systems   Review of Systems  Constitutional: Positive for fatigue. Negative for chills, diaphoresis and fever.  HENT: Negative for congestion.   Respiratory: Positive for chest tightness and shortness of breath. Negative for cough, wheezing and stridor.   Cardiovascular: Positive for chest pain and leg swelling. Negative for palpitations and near-syncope.  Gastrointestinal: Positive for abdominal pain, constipation, nausea and vomiting. Negative for abdominal distention and diarrhea.  Genitourinary: Positive for decreased urine volume. Negative for dysuria.  Musculoskeletal: Negative for back pain, neck pain and neck stiffness.  Skin: Negative for rash and wound.  Neurological: Negative for dizziness, speech difficulty, weakness, light-headedness, numbness and headaches.  Psychiatric/Behavioral: Negative for agitation and confusion.  All other systems reviewed and are negative.   Physical Exam Updated Vital Signs Ht 5\' 11"  (1.803 m)   Wt (!) 149.7 kg   BMI 46.03 kg/m   Physical Exam Vitals and nursing note reviewed.  Constitutional:      General: He is not in acute distress.    Appearance: He is well-developed. He is obese. He is not ill-appearing, toxic-appearing or diaphoretic.  HENT:     Head: Normocephalic and atraumatic.     Nose: No congestion or rhinorrhea.     Mouth/Throat:     Mouth: Mucous membranes are dry.     Pharynx: No oropharyngeal exudate or posterior oropharyngeal erythema.  Eyes:     Extraocular Movements: Extraocular movements intact.     Conjunctiva/sclera: Conjunctivae normal.     Pupils: Pupils are equal, round, and reactive to light.  Cardiovascular:     Rate and Rhythm: Normal rate and regular rhythm.     Heart sounds: Normal heart sounds. No murmur heard.   Pulmonary:     Effort: Pulmonary effort is normal. No respiratory  distress.     Breath sounds: Normal breath sounds. No decreased breath sounds, wheezing, rhonchi or rales.  Abdominal:     General: Abdomen is flat. There is no distension.     Palpations: Abdomen is soft.     Tenderness: There is abdominal tenderness. There is no right CVA tenderness, left CVA tenderness, guarding or rebound.  Musculoskeletal:        General: No tenderness.     Cervical back: Neck supple.     Right lower leg: No tenderness. Edema present.     Left lower leg: No tenderness. Edema present.  Skin:    General: Skin is warm and dry.     Capillary Refill: Capillary refill takes less than 2 seconds.     Coloration: Skin is not pale.     Findings: No erythema.  Neurological:     General: No focal deficit present.     Mental Status:  He is alert.     Sensory: No sensory deficit.  Psychiatric:        Mood and Affect: Mood normal.     ED Results / Procedures / Treatments   Labs (all labs ordered are listed, but only abnormal results are displayed) Labs Reviewed  CBC WITH DIFFERENTIAL/PLATELET - Abnormal; Notable for the following components:      Result Value   MCH 25.8 (*)    All other components within normal limits  COMPREHENSIVE METABOLIC PANEL - Abnormal; Notable for the following components:   Glucose, Bld 109 (*)    BUN 22 (*)    Creatinine, Ser 1.35 (*)    ALT 55 (*)    All other components within normal limits  URINALYSIS, ROUTINE W REFLEX MICROSCOPIC - Abnormal; Notable for the following components:   Specific Gravity, Urine >1.030 (*)    All other components within normal limits  URINE CULTURE  LIPASE, BLOOD  D-DIMER, QUANTITATIVE (NOT AT [facility])  BRAIN NATRIURETIC PEPTIDE  TROPONIN I (HIGH SENSITIVITY)    EKG EKG Interpretation  Date/Time:  Friday June 30 2019 18:04:54 EDT Ventricular Rate:  62 PR Interval:    QRS Duration: 114 QT Interval:  408 QTC Calculation: 415 R Axis:   83 Text Interpretation: Sinus rhythm Borderline intraventricular  conduction delay When compared to prior, no significant cahnges seen. No STEMI Confirmed by Theda Belfastegeler, Chris ([id number]) on 06/30/2019 6:24:12 PM   Radiology DG Chest 2 View  Result Date: 06/30/2019 CLINICAL DATA:  Chest pain, epigastric pain, nausea, shortness of breath, history hypertension, GERD EXAM: CHEST - 2 VIEW COMPARISON:  05/29/2019 FINDINGS: Upper normal heart size. Mediastinal contours and pulmonary vascularity normal. Lungs clear. No acute infiltrate, pleural effusion or pneumothorax. Osseous structures unremarkable. IMPRESSION: No acute abnormalities. Electronically Signed   By: Ulyses SouthwardMark  Boles M.D.   On: 06/30/2019 19:51   US Abdomen Limited RUQ  Result Date: 06/30/2019 CLINICAL DATA:  Epigastric pain EXAM: ULTRASOUND ABDOMEN LIMITED RIGHT UPPER QUADRANT COMPARISON:  CT abdomen and pelvis 08/21/2016 FINDINGS: Gallbladder: Normally distended without stones or wall thickening. No pericholecystic fluid or sonographic Murphy sign. Common bile duct: Diameter: 5 mm, normal Liver: Normal echogenicity without mass or nodularity. Portal vein is patent on color Doppler imaging with normal direction of blood flow towards the liver. Other: No RIGHT upper quadrant free fluid. IMPRESSION: Normal exam. Electronically Signed   By: Ulyses SouthwardMark  Boles M.D.   On: 06/30/2019 19:34    Procedures Procedures (including critical care time)  Medications Ordered in ED Medications  ondansetron (ZOFRAN) injection 4 mg (4 mg Intravenous Given 06/30/19 1941)    ED Course  I have reviewed the triage vital signs and the nursing notes.  Pertinent labs & imaging results that were available during my care of the patient were reviewed by me and considered in my medical decision making (see chart for details).    MDM Rules/Calculators/A&P                          Simonne Martinetntwone Kattner is a 39 y.o. male with a past medical history significant for prediabetes, hypertension, hypercholesterolemia, GERD, obesity, and irritable bowel  syndrome who presents with several weeks of worsening chest pain, epigastric pain, nausea, shortness of breath, fatigue, lightheadedness, decreased urination, constipation, and peripheral edema.  He reports that all the symptoms have been worsening over the last week.  He reports he has had ongoing shoulder problems and had an MRI done today for  his left shoulder pain.  He is also has some dental pains and is following up with a dentist for his dental pain.  He reports that over the last week he has had worsening chest discomfort he describes as moderate and pressure-like in his central chest in his epigastric and right upper quadrant area.  He denies history of gallbladder problems or surgeries.  He reports nausea but no vomiting.  Reports constipation but no diarrhea.  He reports his urine has been decreased.  He reports he is had worsened peripheral edema in both of his legs bilaterally.  He denies any trauma.  He reports no significant cough, fevers, or chills.  Reports he is taken medications for reflux and GI symptoms without much relief.  He denies other complaints.  On exam, lungs are clear and chest is tender to palpation.  Epigastric is very tender in the epigastric and right upper quadrant.  Bowel sounds appreciated.  Patient has edema in both legs but has palpable pulses.  Normal sensation and strength distally.  Good pulses in upper extremities.  Neck and back nontender.  No CVA tenderness.  Patient resting comfortably otherwise.  EKG appears similar to prior with no STEMI.  Patient will work-up to look for etiology of his symptoms.  He does report his pain is pleuritic, given his pleuritic chest discomfort, will get a D-dimer as well as other labs.  We will get BNP given the edema in his legs.  Will get labs and urinalysis given the change in urination.  We will get troponins and chest x-ray.  If work-up is completely reassuring, anticipate discharge home however, will wait for results.   Patient is okay with waiting for results before medications.  Work-up returned reassuring.  Patient has improved symptoms.  Patient would like rectal Phenergan to see if this will be more tolerated at home.  Otherwise his work-up is reassuring.  Patient discharged home with plans to follow with PCP and understands return precautions.  Patient discharged in good condition.   Final Clinical Impression(s) / ED Diagnoses Final diagnoses:  Epigastric pain  Precordial pain  Non-intractable vomiting with nausea, unspecified vomiting type    Rx / DC Orders ED Discharge Orders         Ordered    promethazine (PHENERGAN) 25 MG suppository  Every 6 hours PRN     Discontinue  Reprint     06/30/19 2236           Zakery Normington, Canary Brim, MD 07/01/19 0033   Clinical Impression: 1. Precordial pain   2. Epigastric pain   3. Non-intractable vomiting with nausea, unspecified vomiting type     Disposition: Discharge  Condition: Good  I have discussed the results, Dx and Tx plan with the pt(& family if present). He/she/they expressed understanding and agree(s) with the plan. Discharge instructions discussed at great length. Strict return precautions discussed and pt &/or family have verbalized understanding of the instructions. No further questions at time of discharge.    Discharge Medication List as of 06/30/2019 10:38 PM    START taking these medications   Details  promethazine (PHENERGAN) 25 MG suppository Place 1 suppository (25 mg total) rectally every 6 (six) hours as needed for nausea or vomiting., Starting Fri 06/30/2019, Print        Follow Up: Maudie Flakes, FNP [address] [place] [address] [phone]     [facility] [address] [place]  [address] [phone] Schedule an appointment as soon as possible for a visit    [place] [address] [id number] MP NTIR [place] [phone]      Kain Milosevic, Gwenyth Allegra, MD 07/01/19 [phone]

## 2019-06-30 NOTE — Discharge Instructions (Signed)
Your work-up today was overall reassuring.  No evidence of new cardiac or abdominal abnormality and the ultrasound showed no evidence of gallbladder problems.  Your nausea improved with medications and we feel you are safe for discharge home with a prescription for nausea medication.  Please use the rectal suppository to make sure you get the medicine into your system.  Please rest and stay hydrated.  Please follow-up with your primary doctor.  If any symptoms change or worsen, please return to the nearest emergency department.

## 2019-07-01 LAB — URINE CULTURE: Culture: NO GROWTH

## 2020-02-04 ENCOUNTER — Emergency Department (HOSPITAL_BASED_OUTPATIENT_CLINIC_OR_DEPARTMENT_OTHER): Payer: Medicaid Other

## 2020-02-04 ENCOUNTER — Emergency Department (HOSPITAL_BASED_OUTPATIENT_CLINIC_OR_DEPARTMENT_OTHER)
Admission: EM | Admit: 2020-02-04 | Discharge: 2020-02-04 | Disposition: A | Discharge status: Home / Self Care | Payer: Medicaid Other | Attending: Emergency Medicine | Admitting: Emergency Medicine

## 2020-02-04 ENCOUNTER — Encounter (HOSPITAL_BASED_OUTPATIENT_CLINIC_OR_DEPARTMENT_OTHER): Payer: Self-pay | Admitting: Emergency Medicine

## 2020-02-04 ENCOUNTER — Other Ambulatory Visit: Payer: Self-pay

## 2020-02-04 DIAGNOSIS — R1013 Epigastric pain: Secondary | ICD-10-CM

## 2020-02-04 DIAGNOSIS — Z79899 Other long term (current) drug therapy: Secondary | ICD-10-CM | POA: Diagnosis not present

## 2020-02-04 DIAGNOSIS — Z87891 Personal history of nicotine dependence: Secondary | ICD-10-CM | POA: Diagnosis not present

## 2020-02-04 DIAGNOSIS — Z20822 Contact with and (suspected) exposure to covid-19: Secondary | ICD-10-CM | POA: Diagnosis not present

## 2020-02-04 DIAGNOSIS — R059 Cough, unspecified: Secondary | ICD-10-CM | POA: Insufficient documentation

## 2020-02-04 DIAGNOSIS — I10 Essential (primary) hypertension: Secondary | ICD-10-CM | POA: Insufficient documentation

## 2020-02-04 DIAGNOSIS — R0789 Other chest pain: Secondary | ICD-10-CM

## 2020-02-04 DIAGNOSIS — K219 Gastro-esophageal reflux disease without esophagitis: Secondary | ICD-10-CM | POA: Diagnosis not present

## 2020-02-04 LAB — CBC
HCT: 42.7 % (ref 39.0–52.0)
Hemoglobin: 13.9 g/dL (ref 13.0–17.0)
MCH: 26.1 pg (ref 26.0–34.0)
MCHC: 32.6 g/dL (ref 30.0–36.0)
MCV: 80.3 fL (ref 80.0–100.0)
Platelets: 287 10*3/uL (ref 150–400)
RBC: 5.32 MIL/uL (ref 4.22–5.81)
RDW: 14.2 % (ref 11.5–15.5)
WBC: 4.5 10*3/uL (ref 4.0–10.5)
nRBC: 0 % (ref 0.0–0.2)

## 2020-02-04 LAB — TROPONIN I (HIGH SENSITIVITY)
Troponin I (High Sensitivity): 5 ng/L
Troponin I (High Sensitivity): 6 ng/L (ref <18)

## 2020-02-04 LAB — COMPREHENSIVE METABOLIC PANEL WITH GFR
ALT: 57 U/L — ABNORMAL HIGH (ref 0–44)
AST: 35 U/L (ref 15–41)
Albumin: 4.1 g/dL (ref 3.5–5.0)
Alkaline Phosphatase: 58 U/L (ref 38–126)
Anion gap: 10 (ref 5–15)
BUN: 18 mg/dL (ref 6–20)
CO2: 27 mmol/L (ref 22–32)
Calcium: 9 mg/dL (ref 8.9–10.3)
Chloride: 100 mmol/L (ref 98–111)
Creatinine, Ser: 1.35 mg/dL — ABNORMAL HIGH (ref 0.61–1.24)
GFR, Estimated: >60 mL/min
Glucose, Bld: 81 mg/dL (ref 70–99)
Potassium: 3.6 mmol/L (ref 3.5–5.1)
Sodium: 137 mmol/L (ref 135–145)
Total Bilirubin: 0.4 mg/dL (ref 0.3–1.2)
Total Protein: 6.9 g/dL (ref 6.5–8.1)

## 2020-02-04 LAB — URINALYSIS, ROUTINE W REFLEX MICROSCOPIC
Bilirubin Urine: NEGATIVE
Glucose, UA: NEGATIVE mg/dL
Hgb urine dipstick: NEGATIVE
Ketones, ur: NEGATIVE mg/dL
Leukocytes,Ua: NEGATIVE
Nitrite: NEGATIVE
Protein, ur: NEGATIVE mg/dL
Specific Gravity, Urine: 1.015 (ref 1.005–1.030)
pH: 7 (ref 5.0–8.0)

## 2020-02-04 LAB — LIPASE, BLOOD: Lipase: 45 U/L (ref 11–51)

## 2020-02-04 MED ORDER — LIDOCAINE VISCOUS HCL 2 % MT SOLN: 15.0000 mL | OROMUCOSAL | 0 refills | Status: AC | PRN

## 2020-02-04 MED ORDER — MORPHINE SULFATE (PF) 4 MG/ML IV SOLN: 4.0000 mg | Freq: Once | INTRAVENOUS | Status: DC

## 2020-02-04 MED ORDER — SODIUM CHLORIDE 0.9 % IV BOLUS
1000.0000 mL | Freq: Once | INTRAVENOUS | Status: AC
  Administered 2020-02-04: 1000 mL via INTRAVENOUS

## 2020-02-04 MED ORDER — ALUM & MAG HYDROXIDE-SIMETH 200-200-20 MG/5ML PO SUSP
30.0000 mL | Freq: Once | ORAL | Status: AC
  Administered 2020-02-04: 30 mL via ORAL
  Filled 2020-02-04: qty 30

## 2020-02-04 MED ORDER — IOHEXOL 350 MG/ML SOLN
100.0000 mL | Freq: Once | INTRAVENOUS | Status: AC | PRN
  Administered 2020-02-04: 100 mL via INTRAVENOUS

## 2020-02-04 MED ORDER — SUCRALFATE 1 G PO TABS: 1.0000 g | ORAL_TABLET | Freq: Three times a day (TID) | ORAL | 0 refills | Status: DC

## 2020-02-04 MED ORDER — LIDOCAINE VISCOUS HCL 2 % MT SOLN
15.0000 mL | Freq: Once | OROMUCOSAL | Status: AC
  Administered 2020-02-04: 15 mL via ORAL
  Filled 2020-02-04: qty 15

## 2020-02-04 MED ORDER — ONDANSETRON HCL 4 MG/2ML IJ SOLN
4.0000 mg | Freq: Once | INTRAMUSCULAR | Status: AC
  Administered 2020-02-04: 4 mg via INTRAVENOUS
  Filled 2020-02-04: qty 2

## 2020-02-04 MED ORDER — PANTOPRAZOLE SODIUM 40 MG IV SOLR
40.0000 mg | Freq: Once | INTRAVENOUS | Status: AC
  Administered 2020-02-04: 40 mg via INTRAVENOUS
  Filled 2020-02-04: qty 40

## 2020-02-04 NOTE — ED Notes (Signed)
Patient transported to CT 

## 2020-02-04 NOTE — Discharge Instructions (Signed)
Your work-up here was reassuring.  We have swabbed you here for COVID.  Your results will be on MyChart.  I started you on 2 additional medications to help with your reflux.  Have close follow-up with GI.  Return for new or worsening symptoms

## 2020-02-04 NOTE — ED Notes (Signed)
Provided fluid for po challenge

## 2020-02-04 NOTE — ED Triage Notes (Signed)
Generalized abd pain with nausea. Pain radiates into his chest, L shoulder, and back pain. He was seen at [facility] and given medication for nausea with minimal relief.

## 2020-02-04 NOTE — ED Notes (Signed)
Pt tolerated liquids well

## 2020-02-04 NOTE — ED Notes (Signed)
ED Provider at bedside. 

## 2020-02-04 NOTE — ED Provider Notes (Signed)
[facility] EMERGENCY DEPARTMENT Provider Note   CSN: [id number] Arrival date & time: 02/04/20  1030    History Chief Complaint  Patient presents with  . Abdominal Pain    Glenn Martin is a 40 y.o. male with past medical history significant for GERD, hypertension, IBS, obesity, chronic left shoulder pain who presents for evaluation of chest and abdominal pain.  Began 1 week ago.  Was seen by urgent care.  Given Zofran without relief.  Patient states he has chest and abdominal pain that radiates into his back.  Chest pain is not worse with exertion.  Describes the chest pain as an aching, burning sensation.  States he has had a cough, aches all over.  He has had 1 dose of the Covid vaccine.  Supposed to be vaccinated today however came here due to pain.  States he has been seen for similar symptoms previously.  He is followed by GI and told he has "severe reflux."  He takes Mylanta for this, PPI.  His chronic medications have not helped.  Rates his pain a 5/10.  Pain is nonpleuritic in nature or exertional.  No history of chronic NSAID use, chronic EtOH use.  No melena or bright blood per rectum.  No associated diaphoresis with symptoms.  Feels like his prior reflux however "much much worse."  Denies additional aggravating or alleviating factors.  History obtained from patient and past medical records.  No interpreter used  HPI     Past Medical History:  Diagnosis Date  . GERD (gastroesophageal reflux disease)   . High cholesterol   . Hypertension   . IBS (irritable bowel syndrome)   . Obesity   . Prediabetes   . Seasonal allergies     There are no problems to display for this patient.   Past Surgical History:  Procedure Laterality Date  . HIP SURGERY Bilateral   . SHOULDER SURGERY         No family history on file.  Social History   Tobacco Use  . Smoking status: Former Smoker    Packs/day: 0.00  . Smokeless tobacco: Never Used  . Tobacco comment: 1  pack/week  Vaping Use  . Vaping Use: Never used  Substance Use Topics  . Alcohol use: Not Currently    Comment: occ  . Drug use: No    Home Medications Prior to Admission medications   Medication Sig Start Date End Date Taking? Authorizing Provider  amLODipine (NORVASC) 5 MG tablet Take 10 mg by mouth daily.    Yes [provider]  atenolol (TENORMIN) 25 MG tablet Take 25 mg by mouth daily.  07/08/17  Yes [provider]  ezetimibe (ZETIA) 10 MG tablet Take by mouth. 11/12/17  Yes [provider]  lidocaine (XYLOCAINE) 2 % solution Use as directed 15 mLs in the mouth or throat as needed for mouth pain. 02/04/20  Yes Sumit Branham A, PA-C  pantoprazole (PROTONIX) 40 MG tablet Take by mouth. 07/26/18  Yes [provider]  sucralfate (CARAFATE) 1 g tablet Take 1 tablet (1 g total) by mouth 4 (four) times daily -  with meals and at bedtime for 10 days. 02/04/20 02/14/20 Yes Kristena Wilhelmi A, PA-C  vitamin C (ASCORBIC ACID) 500 MG tablet Take 500 mg by mouth daily.   Yes [provider]  Vitamin D, Ergocalciferol, (DRISDOL) 50000 units CAPS capsule Take 50,000 Units by mouth every 7 (seven) days. On mondays   Yes [provider]  omeprazole (  PRILOSEC) 40 MG capsule Take 40 mg by mouth 2 (two) times daily.  02/04/20 Yes [provider]  albuterol (PROVENTIL HFA;VENTOLIN HFA) 108 (90 Base) MCG/ACT inhaler Inhale 2 puffs into the lungs every 4 (four) hours as needed for wheezing or shortness of breath. 02/18/18   Cristina GongHammond, Elizabeth W, PA-C  amoxicillin-clavulanate (AUGMENTIN) 875-125 MG tablet Take 1 tablet by mouth 2 (two) times daily. 06/18/19   [provider]  cetirizine (ZYRTEC) 10 MG tablet Take 10 mg by mouth daily.    [provider]  chlorthalidone (HYGROTON) 50 MG tablet Take 50 mg by mouth daily. 07/05/17   [provider]  diclofenac (VOLTAREN) 75 MG EC tablet Take 75 mg by mouth 2 (two) times daily.  05/19/19   [provider]  gabapentin (NEURONTIN) 300 MG capsule 2 caps in morning, 2 caps at bedtime. 11/18/17   [provider]  linaclotide (LINZESS) 72 MCG capsule Take 72 mcg by mouth daily before breakfast.    [provider]  glycopyrrolate (ROBINUL) 2 MG tablet Take 2 mg by mouth daily.  02/04/20  [provider]  promethazine (PHENERGAN) 25 MG suppository Place 1 suppository (25 mg total) rectally every 6 (six) hours as needed for nausea or vomiting. 06/30/19 02/04/20  Tegeler, Canary Brimhristopher J, MD    Allergies    Atorvastatin, Other, and Shellfish allergy  Review of Systems   Review of Systems  Constitutional: Positive for activity change and fatigue.  HENT: Positive for congestion and rhinorrhea.   Respiratory: Positive for cough. Negative for shortness of breath.   Cardiovascular: Positive for chest pain. Negative for palpitations and leg swelling.  Gastrointestinal: Positive for abdominal pain and nausea. Negative for abdominal distention, anal bleeding, blood in stool, constipation, diarrhea, rectal pain and vomiting.  Genitourinary: Negative.   Musculoskeletal: Negative.   Skin: Negative.   Neurological: Negative.   All other systems reviewed and are negative.   Physical Exam Updated Vital Signs BP (!) 145/75   Pulse (!) 59   Temp 98.5 F (36.9 C) (Oral)   Resp (!) 21   Ht 5\' 11"  (1.803 m)   Wt (!) 152 kg   SpO2 100%   BMI 46.72 kg/m   Physical Exam Vitals and nursing note reviewed.  Constitutional:      General: He is not in acute distress.    Appearance: He is well-developed and well-nourished. He is obese. He is not ill-appearing, toxic-appearing or diaphoretic.  HENT:     Head: Normocephalic and atraumatic.     Mouth/Throat:     Mouth: Mucous membranes are moist.  Eyes:     Pupils: Pupils are equal, round, and reactive to light.  Cardiovascular:     Rate and Rhythm: Normal rate and regular rhythm.     Pulses: Normal  pulses.          Radial pulses are 2+ on the right side and 2+ on the left side.       Dorsalis pedis pulses are 2+ on the right side and 2+ on the left side.     Heart sounds: Normal heart sounds.  Pulmonary:     Effort: Pulmonary effort is normal. No respiratory distress.     Breath sounds: Normal breath sounds.     Comments: Speaks in full sentences without difficulty.  Clear to auscultation bilaterally Abdominal:     General: Bowel sounds are normal. There is no distension.     Palpations: Abdomen is soft.  Tenderness: There is abdominal tenderness in the epigastric area. There is no right CVA tenderness, left CVA tenderness, guarding or rebound. Negative signs include Murphy's sign.     Hernia: No hernia is present.     Comments: Tenderness epigastric region.  No rebound or guarding.  Negative Murphy sign  Musculoskeletal:        General: Normal range of motion.     Cervical back: Normal range of motion and neck supple.  Skin:    General: Skin is warm and dry.     Capillary Refill: Capillary refill takes less than 2 seconds.     Comments: No edema, erythema or warmth.  No fluctuance or induration.  Neurological:     General: No focal deficit present.     Mental Status: He is alert and oriented to person, place, and time.  Psychiatric:        Mood and Affect: Mood and affect normal.     ED Results / Procedures / Treatments   Labs (all labs ordered are listed, but only abnormal results are displayed) Labs Reviewed  COMPREHENSIVE METABOLIC PANEL - Abnormal; Notable for the following components:      Result Value   Creatinine, Ser 1.35 (*)    ALT 57 (*)    All other components within normal limits  SARS CORONAVIRUS 2 (TAT 6-24 HRS)  LIPASE, BLOOD  CBC  URINALYSIS, ROUTINE W REFLEX MICROSCOPIC  TROPONIN I (HIGH SENSITIVITY)  TROPONIN I (HIGH SENSITIVITY)    EKG EKG Interpretation  Date/Time:  Sunday February 04 2020 10:36:43 EST Ventricular Rate:  73 PR  Interval:  160 QRS Duration: 104 QT Interval:  366 QTC Calculation: 403 R Axis:   150 Text Interpretation: Normal sinus rhythm Right axis deviation ST & T wave abnormality, consider inferior ischemia Abnormal ECG Confirmed by Benjiman Core [phone]) on 02/04/2020 11:57:11 AM   Radiology CT Angio Chest/Abd/Pel for Dissection W and/or W/WO  Result Date: 02/04/2020 CLINICAL DATA:  Generalized abdominal pain with nausea, pain radiating into chest, LEFT shoulder, and back, aortic dissection assessment EXAM: CT ANGIOGRAPHY CHEST, ABDOMEN AND PELVIS TECHNIQUE: Non-contrast CT of the chest was initially obtained. Multidetector CT imaging through the chest, abdomen and pelvis was performed using the standard protocol during bolus administration of intravenous contrast. Multiplanar reconstructed images and MIPs were obtained and reviewed to evaluate the vascular anatomy. CONTRAST:  OMNIPAQUE IOHEXOL 350 MG/ML SOLN IV. No oral contrast for this indication. COMPARISON:  CT abdomen and pelvis 08/21/2016 FINDINGS: CTA CHEST FINDINGS Cardiovascular: Heart unremarkable. No pericardial effusion. Aorta normal caliber. No intramural hematoma on precontrast images. Following contrast, normal aortic enhancement without aneurysm or dissection. Pulmonary arteries patent on non targeted exam. Mediastinum/Nodes: Esophagus unremarkable. Base of cervical region normal appearance. No thoracic adenopathy. Lungs/Pleura: Lungs clear. No pulmonary infiltrate, pleural effusion, or pneumothorax. No pulmonary mass/nodule. Musculoskeletal: Osseous structures unremarkable Review of the MIP images confirms the above findings. CTA ABDOMEN AND PELVIS FINDINGS VASCULAR Aorta: Normal caliber without aneurysm or dissection. No atherosclerotic plaque formation. Celiac: Widely patent SMA: Widely patent Renals: Single renal arteries bilaterally, widely patent IMA: Patent Inflow: Widely patent Veins: Unopacified Review of the MIP images  confirms the above findings. NON-VASCULAR Hepatobiliary: Gallbladder and liver normal appearance Pancreas: Normal appearance Spleen: Normal appearance.  Tiny splenule anterior to spleen. Adrenals/Urinary Tract: Adrenal glands, kidneys, ureters, and bladder normal appearance Stomach/Bowel: Normal appendix. Stomach and bowel loops normal appearance. Lymphatic: No adenopathy. Reproductive: Unremarkable prostate gland and seminal vesicles Other: No free air or  free fluid. No definite hernia or inflammatory process. Musculoskeletal: Unremarkable Review of the MIP images confirms the above findings. IMPRESSION: Normal CTA chest, abdomen and pelvis. No evidence of aortic aneurysm, dissection, or significant atherosclerotic plaque formation. No acute intrathoracic, intra-abdominal, or intrapelvic abnormalities. Electronically Signed   By: Ulyses Southward M.D.   On: 02/04/2020 13:32    Procedures Procedures   Medications Ordered in ED Medications  morphine 4 MG/ML injection 4 mg (4 mg Intravenous Not Given 02/04/20 1210)  sodium chloride 0.9 % bolus 1,000 mL (0 mLs Intravenous Stopped 02/04/20 1341)  ondansetron (ZOFRAN) injection 4 mg (4 mg Intravenous Given 02/04/20 1203)  pantoprazole (PROTONIX) injection 40 mg (40 mg Intravenous Given 02/04/20 1201)  alum & mag hydroxide-simeth (MAALOX/MYLANTA) 200-200-20 MG/5ML suspension 30 mL (30 mLs Oral Given 02/04/20 1200)    And  lidocaine (XYLOCAINE) 2 % viscous mouth solution 15 mL (15 mLs Oral Given 02/04/20 1201)  iohexol (OMNIPAQUE) 350 MG/ML injection 100 mL (100 mLs Intravenous Contrast Given 02/04/20 1250)    ED Course  I have reviewed the triage vital signs and the nursing notes.  Pertinent labs & imaging results that were available during my care of the patient were reviewed by me and considered in my medical decision making (see chart for details).  40 year old presents for evaluation of chest and abdominal pain.  History of GERD.  No exertional component.   Does have left shoulder pain however states he has chronic left shoulder pain.  Symptoms began 1 week ago.  He has been seen multiple times for similar complaints.  Diffuse tenderness to epigastric region however negative Murphy sign.  Wells criteria low risk, PERC negative neurovascularly intact.  Heart and lungs clear.  Has had some congestion, rhinorrhea as well as cough.  He has had one dose of COVID vaccine and is therefore not fully vaccinated.  No known exposures.  We'll plan on labs, imaging and reassess.  Labs and imaging personally reviewed and interpreted:  CBC without leukocytosis Lipase 45 Troponin 5>>6 UA negative Metabolic panel with creatinine 1.35 at baseline, ALT 57, similar to prior EKG without ischemic changes CTA C/A/P without any acute abnormality  Patient reassessed. Pain resolved with GI cocktail, Pepcid.  Patient with reassuring labs, flat delta troponin.  Symptoms not seem consistent with PE, dissection, ACS.  Feel symptoms likely related to his known reflux.  I discussed adding Carafate as well as viscous lidocaine to his regimen at home.  He has appointment with GI on Wednesday or Thursday.  Given he has had myalgias and cough did obtain Covid test.  He will follow up with results on MyChart.  Patient is nontoxic, nonseptic appearing, in no apparent distress.  Patient's pain and other symptoms adequately managed in emergency department.  Fluid bolus given.  Labs, imaging and vitals reviewed.  Patient does not meet the SIRS or Sepsis criteria.  On repeat exam patient does not have a surgical abdomin and there are no peritoneal signs.  No indication of appendicitis, bowel obstruction, bowel perforation, cholecystitis, diverticulitis. Patient is to be discharged with recommendation to follow up with PCP inChest pain is not likely of cardiac or pulmonary etiology d/t presentation, PERC negative, VSS, no tracheal deviation, no JVD or new murmur, RRR, breath sounds equal  bilaterally, EKG without acute abnormalities, negative troponin, and negative CXR. Pt has been advised to return to the ED if CP becomes exertional, associated with diaphoresis or nausea, radiates to left jaw/arm, worsens or becomes concerning in any way.  P  The patient has been appropriately medically screened and/or stabilized in the ED. I have low suspicion for any other emergent medical condition which would require further screening, evaluation or treatment in the ED or require inpatient management.  Patient is hemodynamically stable and in no acute distress.  Patient able to ambulate in department prior to ED.  Evaluation does not show acute pathology that would require ongoing or additional emergent interventions while in the emergency department or further inpatient treatment.  I have discussed the diagnosis with the patient and answered all questions.  Pain is been managed while in the emergency department and patient has no further complaints prior to discharge.  Patient is comfortable with plan discussed in room and is stable for discharge at this time.  I have discussed strict return precautions for returning to the emergency department.  Patient was encouraged to follow-up with PCP/specialist refer to at discharge.     MDM Rules/Calculators/A&P                          Amadou Katzenstein was evaluated in Emergency Department on 02/04/2020 for the symptoms described in the history of present illness. He was evaluated in the context of the global COVID-19 pandemic, which necessitated consideration that the patient might be at risk for infection with the SARS-CoV-2 virus that causes COVID-19. Institutional protocols and algorithms that pertain to the evaluation of patients at risk for COVID-19 are in a state of rapid change based on information released by regulatory bodies including the CDC and federal and state organizations. These policies and algorithms were followed during the patient's care in  the ED. Final Clinical Impression(s) / ED Diagnoses Final diagnoses:  Epigastric abdominal pain  Gastroesophageal reflux disease, unspecified whether esophagitis present  Person under investigation for COVID-19  Atypical chest pain    Rx / DC Orders ED Discharge Orders         Ordered    sucralfate (CARAFATE) 1 g tablet  3 times daily with meals & bedtime        02/04/20 1448    lidocaine (XYLOCAINE) 2 % solution  As needed        02/04/20 1448           Romani Wilbon A, PA-C 02/04/20 1451    Benjiman Core, MD 02/04/20 1453

## 2020-02-05 LAB — SARS CORONAVIRUS 2 (TAT 6-24 HRS): SARS Coronavirus 2: NEGATIVE

## 2020-10-31 IMAGING — CR DG CHEST 2V
2 series · 2 of 2 positions shown · non-contrast
Comparison: 05/29/2019

CLINICAL DATA: Chest pain, epigastric pain, nausea, shortness of
breath, history hypertension, GERD

EXAM:
CHEST - 2 VIEW

[w chest pa]
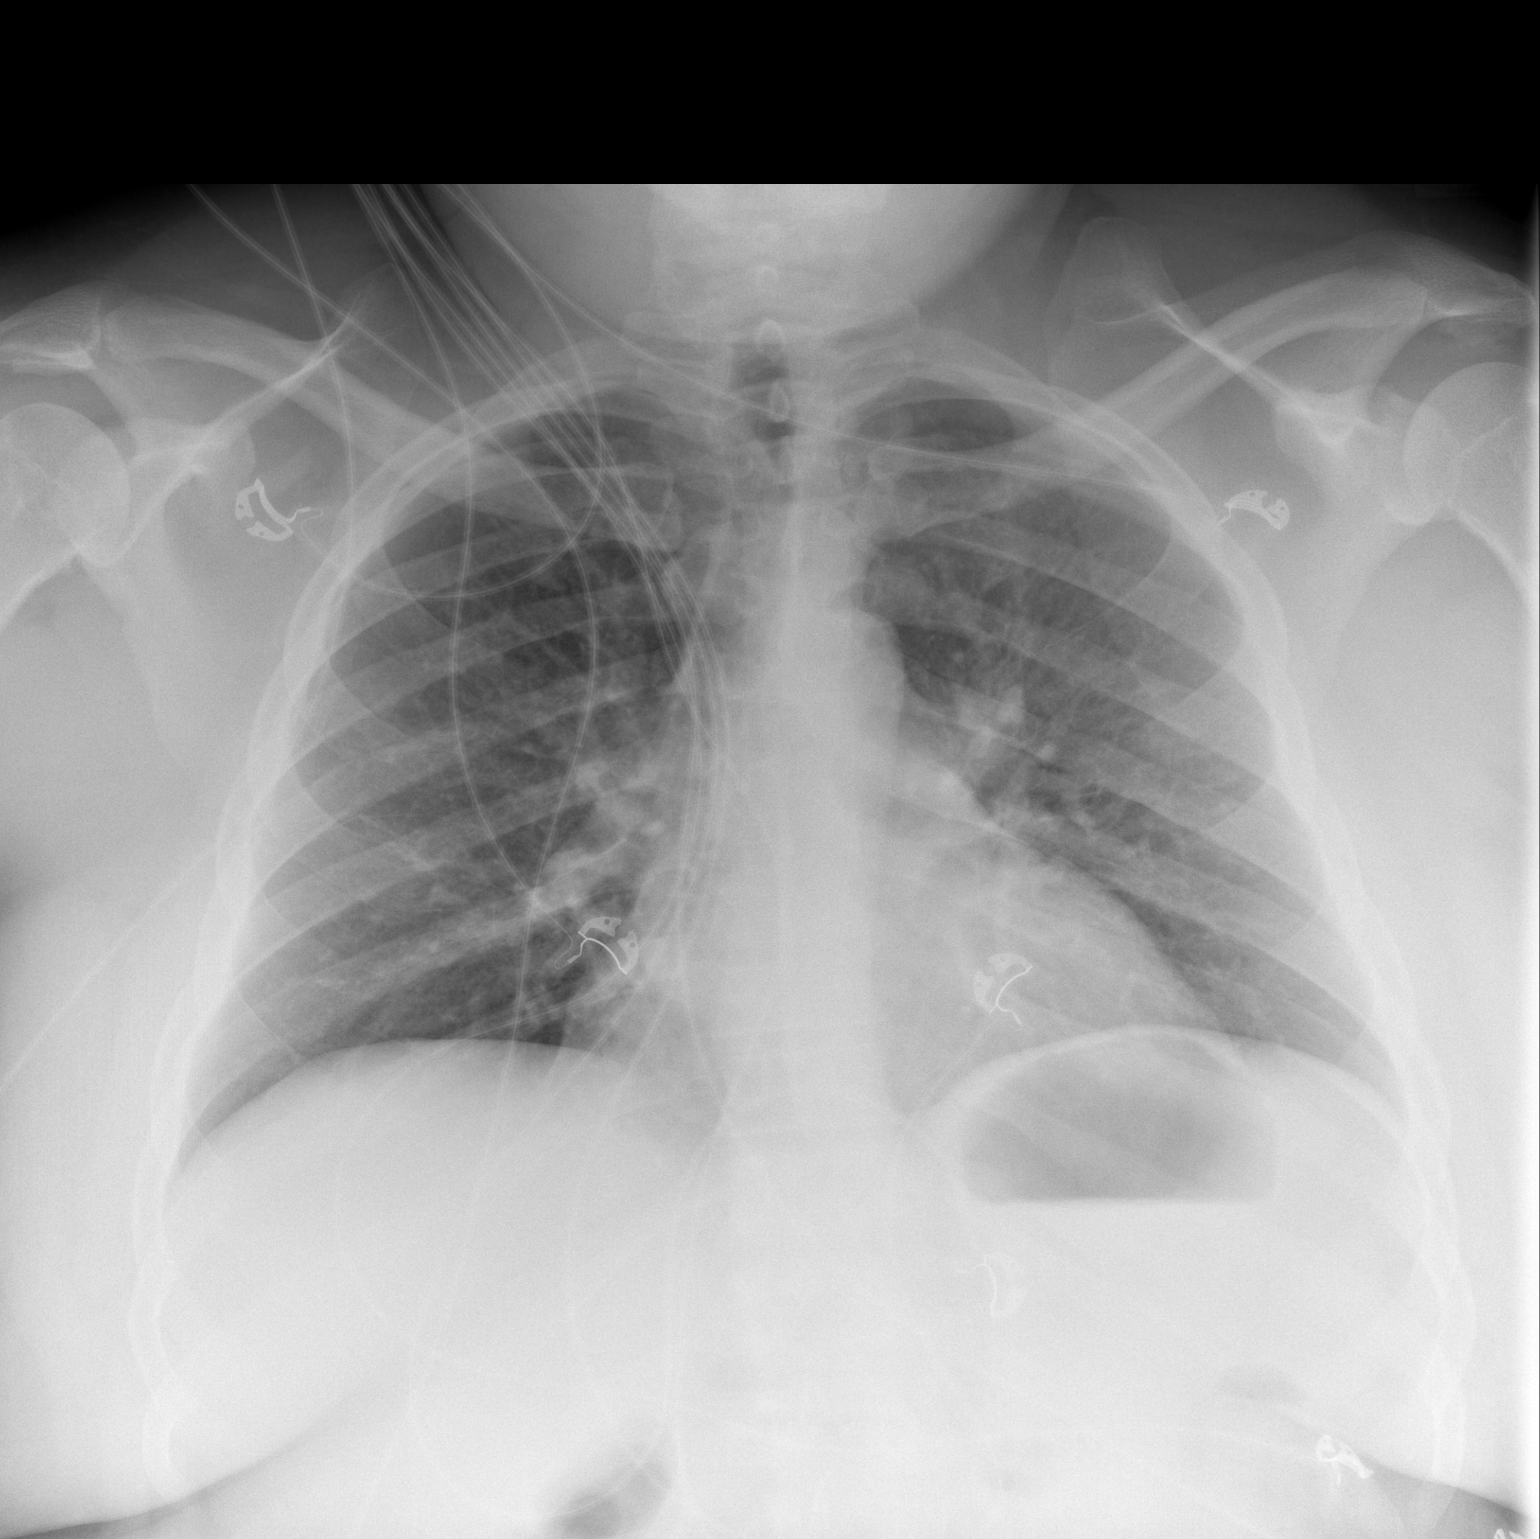

[w chest lat]
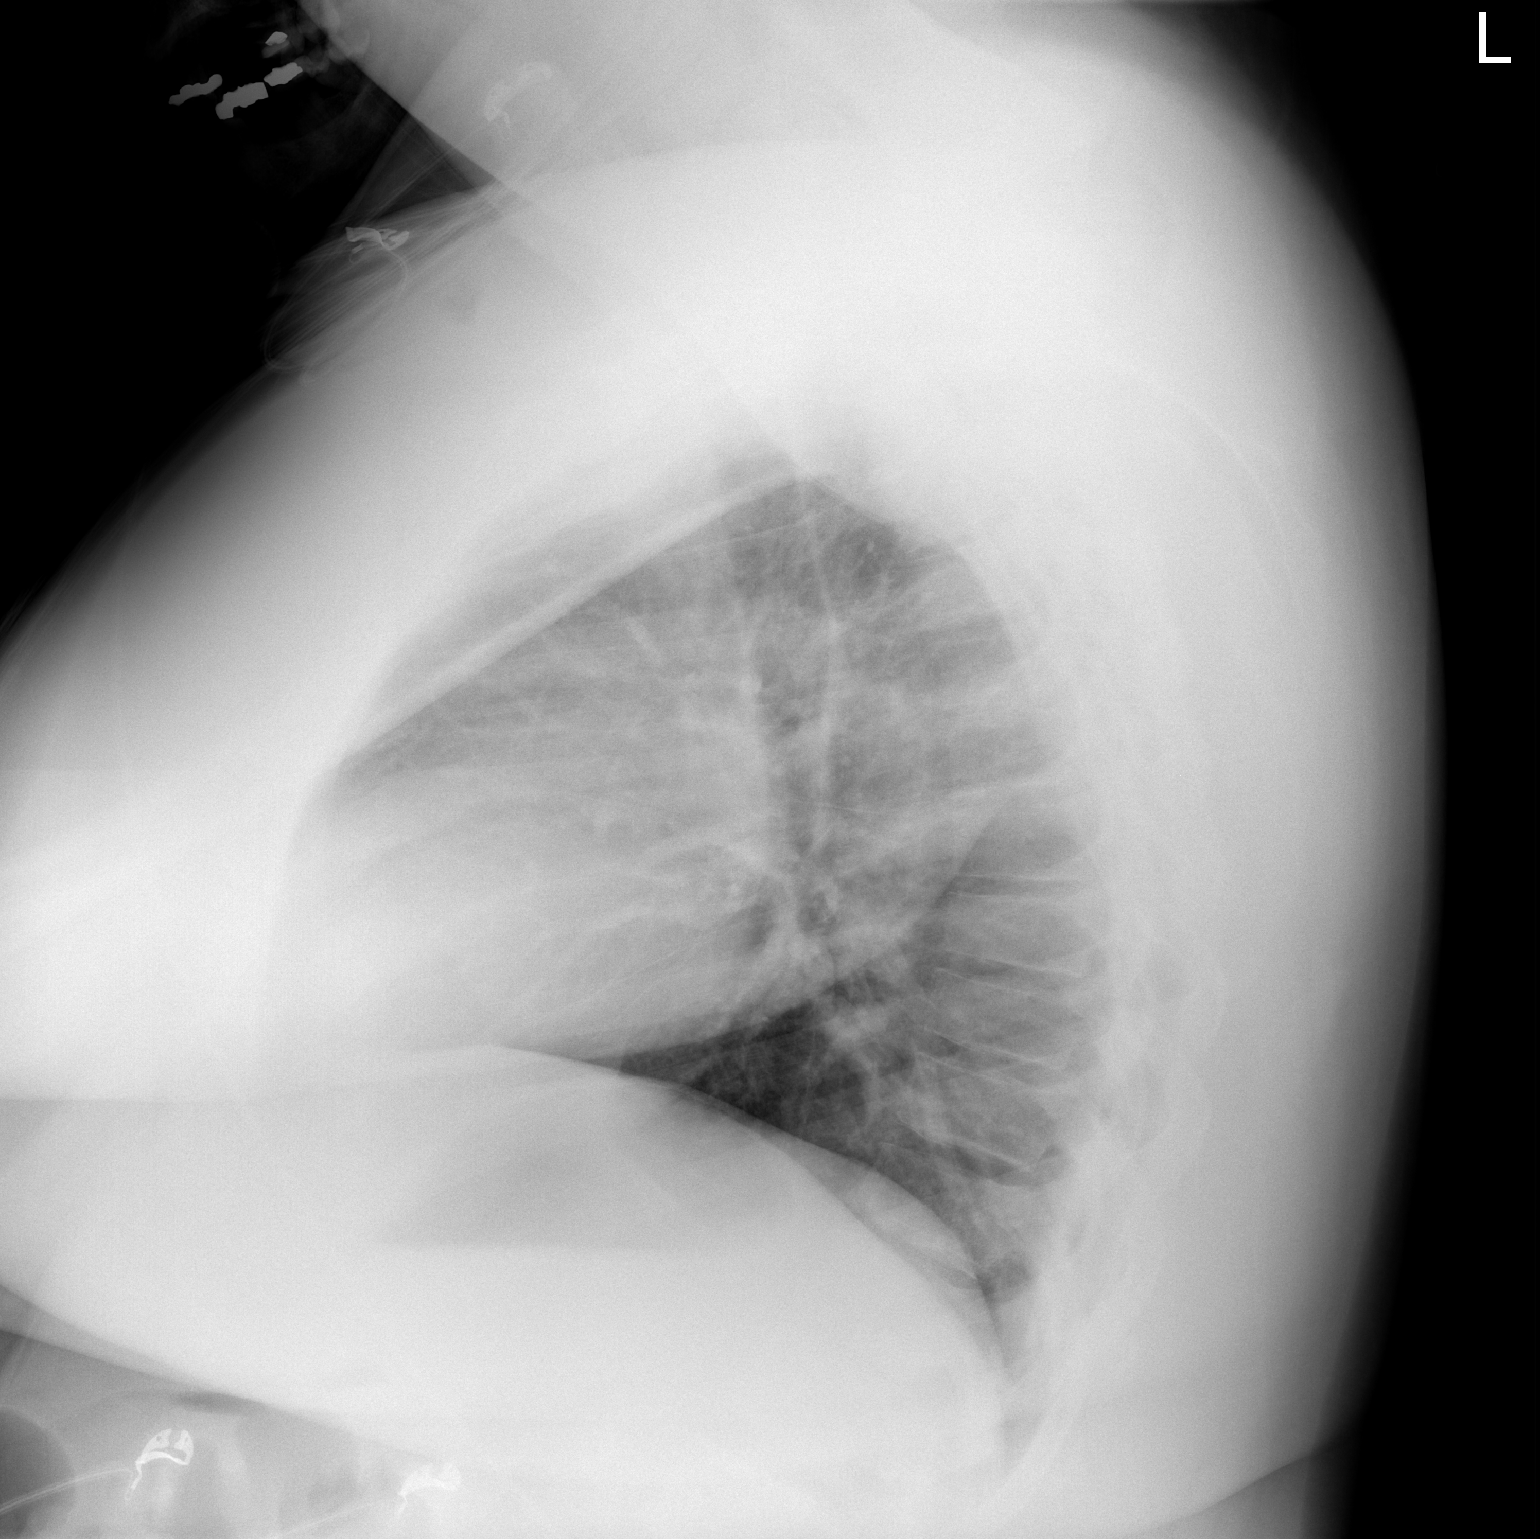

[2 of 2 positions shown; findings below may reference images not displayed]

FINDINGS: Upper normal heart size.

Mediastinal contours and pulmonary vascularity normal.

Lungs clear.

No acute infiltrate, pleural effusion or pneumothorax.

Osseous structures unremarkable.
IMPRESSION: No acute abnormalities.

## 2020-12-07 ENCOUNTER — Emergency Department (HOSPITAL_BASED_OUTPATIENT_CLINIC_OR_DEPARTMENT_OTHER)
Admission: EM | Admit: 2020-12-07 | Discharge: 2020-12-07 | Disposition: A | Discharge status: Home / Self Care | Payer: Medicaid Other | Attending: Emergency Medicine | Admitting: Emergency Medicine

## 2020-12-07 ENCOUNTER — Emergency Department (HOSPITAL_BASED_OUTPATIENT_CLINIC_OR_DEPARTMENT_OTHER): Payer: Medicaid Other

## 2020-12-07 ENCOUNTER — Other Ambulatory Visit: Payer: Self-pay

## 2020-12-07 ENCOUNTER — Encounter (HOSPITAL_BASED_OUTPATIENT_CLINIC_OR_DEPARTMENT_OTHER): Payer: Self-pay

## 2020-12-07 DIAGNOSIS — Z87891 Personal history of nicotine dependence: Secondary | ICD-10-CM | POA: Insufficient documentation

## 2020-12-07 DIAGNOSIS — I10 Essential (primary) hypertension: Secondary | ICD-10-CM | POA: Insufficient documentation

## 2020-12-07 DIAGNOSIS — R072 Precordial pain: Secondary | ICD-10-CM | POA: Diagnosis not present

## 2020-12-07 DIAGNOSIS — Z79899 Other long term (current) drug therapy: Secondary | ICD-10-CM | POA: Diagnosis not present

## 2020-12-07 DIAGNOSIS — R059 Cough, unspecified: Secondary | ICD-10-CM | POA: Insufficient documentation

## 2020-12-07 DIAGNOSIS — Z20822 Contact with and (suspected) exposure to covid-19: Secondary | ICD-10-CM | POA: Insufficient documentation

## 2020-12-07 DIAGNOSIS — R051 Acute cough: Secondary | ICD-10-CM

## 2020-12-07 LAB — CBC WITH DIFFERENTIAL/PLATELET
Abs Immature Granulocytes: 0.03 10*3/uL (ref 0.00–0.07)
Basophils Absolute: 0.1 10*3/uL (ref 0.0–0.1)
Basophils Relative: 1 %
Eosinophils Absolute: 0.3 10*3/uL (ref 0.0–0.5)
Eosinophils Relative: 6 %
HCT: 44.1 % (ref 39.0–52.0)
Hemoglobin: 14.4 g/dL (ref 13.0–17.0)
Immature Granulocytes: 1 %
Lymphocytes Relative: 29 %
Lymphs Abs: 1.4 10*3/uL (ref 0.7–4.0)
MCH: 26.1 pg (ref 26.0–34.0)
MCHC: 32.7 g/dL (ref 30.0–36.0)
MCV: 80 fL (ref 80.0–100.0)
Monocytes Absolute: 0.8 10*3/uL (ref 0.1–1.0)
Monocytes Relative: 16 %
Neutro Abs: 2.3 10*3/uL (ref 1.7–7.7)
Neutrophils Relative %: 47 %
Platelets: 247 10*3/uL (ref 150–400)
RBC: 5.51 MIL/uL (ref 4.22–5.81)
RDW: 14.1 % (ref 11.5–15.5)
WBC: 4.9 10*3/uL (ref 4.0–10.5)
nRBC: 0 % (ref 0.0–0.2)

## 2020-12-07 LAB — BASIC METABOLIC PANEL
Anion gap: 9 (ref 5–15)
BUN: 20 mg/dL (ref 6–20)
CO2: 28 mmol/L (ref 22–32)
Calcium: 8.9 mg/dL (ref 8.9–10.3)
Chloride: 98 mmol/L (ref 98–111)
Creatinine, Ser: 1.35 mg/dL — ABNORMAL HIGH (ref 0.61–1.24)
GFR, Estimated: >60 mL/min (ref >60)
Glucose, Bld: 99 mg/dL (ref 70–99)
Potassium: 3.9 mmol/L (ref 3.5–5.1)
Sodium: 135 mmol/L (ref 135–145)

## 2020-12-07 LAB — TROPONIN I (HIGH SENSITIVITY): Troponin I (High Sensitivity): 6 ng/L (ref <18)

## 2020-12-07 LAB — D-DIMER, QUANTITATIVE: D-Dimer, Quant: 0.42 ug/mL-FEU (ref 0.00–0.50)

## 2020-12-07 NOTE — Discharge Instructions (Signed)
You came to the emergency department today to be evaluated for your chest pain and cough.  Your physical exam was reassuring.  Your lab work was reassuring you are not having an acute heart attack today.  Please follow-up closely with your primary care provider.  Please continue to take medications prescribed by your primary care provider.  Get help right away if: Your chest pain gets worse. You have a cough that gets worse, or you cough up blood. You have severe pain in your abdomen. You faint. You have sudden, unexplained chest discomfort. You have sudden, unexplained discomfort in your arms, back, neck, or jaw. You have shortness of breath at any time. You suddenly start to sweat, or your skin gets clammy. You feel nausea or you vomit. You suddenly feel lightheaded or dizzy. You have severe weakness, or unexplained weakness or fatigue. Your heart begins to beat quickly, or it feels like it is skipping beats.

## 2020-12-07 NOTE — ED Triage Notes (Addendum)
Pt dx with bronchitis on Wednesday. Been taking prescriptions and OTC meds without relief.  C/o chest pain intermittently

## 2020-12-07 NOTE — ED Provider Notes (Signed)
[facility] Provider Note   CSN: [id number] Arrival date & time: 12/07/20  [id number]     History Chief Complaint  Patient presents with   Cough    Glenn Martin is a 40 y.o. male presents to the emergency department with a complaint of cough, shortness of breath, and chest pain.  He has had a productive cough since Tuesday morning.  Cough is producing clear to yellow mucus.  Patient reports that over the last 3 days he has had shortness of breath when attempting to fall asleep.  Patient states that he has had no chest pain or last few days.  Pain has been constant since 1130 this morning.  Patient describes pain as a pressure.  Pain is midsternal and does not radiate.  Pain is worse with coughing.  Patient has not tried any modalities to alleviate his symptoms.  Patient denies any associated nausea, vomiting, diaphoresis, palpitations, or shortness of breath.  Patient denies any hemoptysis, history of DVT or PE, cancer history, surgery last fall 4 weeks, hormone therapy, prolonged travel, tobacco use, family history of heart disease in individuals less than 49 years old.  Per chart review the patient was diagnosed with viral upper respiratory infection by PCP earlier this week.  Patient was prescribed Tessalon, albuterol inhaler, and fluticasone.  Patient was tested for COVID-19 and influenza and he reports that both were negative.   Cough Associated symptoms: shortness of breath   Associated symptoms: no chest pain, no chills, no fever, no headaches and no rash       Past Medical History:  Diagnosis Date   GERD (gastroesophageal reflux disease)    High cholesterol    Hypertension    IBS (irritable bowel syndrome)    Obesity    Prediabetes    Seasonal allergies     There are no problems to display for this patient.   Past Surgical History:  Procedure Laterality Date   HIP SURGERY Bilateral    SHOULDER SURGERY         History reviewed. No  pertinent family history.  Social History   Tobacco Use   Smoking status: Former    Packs/day: 0.00    Types: Cigarettes   Smokeless tobacco: Never   Tobacco comments:    1 pack/week  Vaping Use   Vaping Use: Never used  Substance Use Topics   Alcohol use: Not Currently    Comment: occ   Drug use: No    Home Medications Prior to Admission medications   Medication Sig Start Date End Date Taking? Authorizing Provider  omeprazole (PRILOSEC) 40 MG capsule Take 40 mg by mouth 2 (two) times daily.  02/04/20  [provider]  albuterol (PROVENTIL HFA;VENTOLIN HFA) 108 (90 Base) MCG/ACT inhaler Inhale 2 puffs into the lungs every 4 (four) hours as needed for wheezing or shortness of breath. 02/18/18   Lorin Glass, PA-C  amLODipine (NORVASC) 5 MG tablet Take 10 mg by mouth daily.     [provider]  amoxicillin-clavulanate (AUGMENTIN) 875-125 MG tablet Take 1 tablet by mouth 2 (two) times daily. 06/18/19   [provider]  atenolol (TENORMIN) 25 MG tablet Take 25 mg by mouth daily.  07/08/17   [provider]  cetirizine (ZYRTEC) 10 MG tablet Take 10 mg by mouth daily.    [provider]  chlorthalidone (HYGROTON) 50 MG tablet Take 50 mg by mouth daily. 07/05/17   [provider]  diclofenac (VOLTAREN) 75 MG  EC tablet Take 75 mg by mouth 2 (two) times daily. 05/19/19   [provider]  ezetimibe (ZETIA) 10 MG tablet Take by mouth. 11/12/17   [provider]  gabapentin (NEURONTIN) 300 MG capsule 2 caps in morning, 2 caps at bedtime. 11/18/17   [provider]  lidocaine (XYLOCAINE) 2 % solution Use as directed 15 mLs in the mouth or throat as needed for mouth pain. 02/04/20   Henderly, Britni A, PA-C  linaclotide (LINZESS) 72 MCG capsule Take 72 mcg by mouth daily before breakfast.    [provider]  pantoprazole (PROTONIX) 40 MG tablet Take by mouth. 07/26/18   [provider]  sucralfate  (CARAFATE) 1 g tablet Take 1 tablet (1 g total) by mouth 4 (four) times daily -  with meals and at bedtime for 10 days. 02/04/20 02/14/20  Henderly, Britni A, PA-C  vitamin C (ASCORBIC ACID) 500 MG tablet Take 500 mg by mouth daily.    [provider]  Vitamin D, Ergocalciferol, (DRISDOL) 50000 units CAPS capsule Take 50,000 Units by mouth every 7 (seven) days. On mondays    [provider]  glycopyrrolate (ROBINUL) 2 MG tablet Take 2 mg by mouth daily.  02/04/20  [provider]  promethazine (PHENERGAN) 25 MG suppository Place 1 suppository (25 mg total) rectally every 6 (six) hours as needed for nausea or vomiting. 06/30/19 02/04/20  Tegeler, Gwenyth Allegra, MD    Allergies    Atorvastatin, Other, and Shellfish allergy  Review of Systems   Review of Systems  Constitutional:  Negative for chills and fever.  Eyes:  Negative for visual disturbance.  Respiratory:  Positive for cough and shortness of breath.   Cardiovascular:  Negative for chest pain, palpitations and leg swelling.  Gastrointestinal:  Negative for abdominal pain, diarrhea, nausea and vomiting.  Genitourinary:  Negative for difficulty urinating and dysuria.  Musculoskeletal:  Negative for back pain and neck pain.  Skin:  Negative for color change and rash.  Neurological:  Negative for dizziness, syncope, light-headedness and headaches.  Psychiatric/Behavioral:  Negative for confusion.    Physical Exam Updated Vital Signs BP (!) 157/103 (BP Location: Right Arm)   Pulse 81   Temp 98.2 F (36.8 C) (Oral)   Resp 20   Ht 5\' 11"  (1.803 m)   Wt (!) 152 kg   SpO2 95%   BMI 46.72 kg/m   Physical Exam Vitals and nursing note reviewed.  Constitutional:      General: He is not in acute distress.    Appearance: He is not ill-appearing, toxic-appearing or diaphoretic.  HENT:     Head: Normocephalic.  Eyes:     General: No scleral icterus.       Right eye: No discharge.        Left eye: No discharge.   Cardiovascular:     Rate and Rhythm: Normal rate.     Pulses:          Radial pulses are 2+ on the right side and 2+ on the left side.  Pulmonary:     Effort: Pulmonary effort is normal. No tachypnea, bradypnea or respiratory distress.     Breath sounds: Normal breath sounds. No stridor. No decreased breath sounds, wheezing, rhonchi or rales.     Comments: Speaks in full sentences without difficulty Chest:     Chest wall: No tenderness.  Musculoskeletal:     Right lower leg: Normal.     Left lower leg: Normal.  Skin:  General: Skin is warm and dry.  Neurological:     General: No focal deficit present.     Mental Status: He is alert.     GCS: GCS eye subscore is 4. GCS verbal subscore is 5. GCS motor subscore is 6.  Psychiatric:        Behavior: Behavior is cooperative.    ED Results / Procedures / Treatments   Labs (all labs ordered are listed, but only abnormal results are displayed) Labs Reviewed  BASIC METABOLIC PANEL - Abnormal; Notable for the following components:      Result Value   Creatinine, Ser 1.35 (*)    All other components within normal limits  CBC WITH DIFFERENTIAL/PLATELET  D-DIMER, QUANTITATIVE  TROPONIN I (HIGH SENSITIVITY)  TROPONIN I (HIGH SENSITIVITY)    EKG EKG Interpretation  Date/Time:  Saturday December 07 2020 19:14:01 EST Ventricular Rate:  75 PR Interval:  154 QRS Duration: 102 QT Interval:  370 QTC Calculation: 413 R Axis:   19 Text Interpretation: Normal sinus rhythm Normal ECG TW abnormality lead III new, prior other TW abnormalities resolved in comparison to prior Confirmed by Alvira Monday ([id number]) on 12/07/2020 10:47:57 PM  Radiology DG Chest 2 View  Result Date: 12/07/2020 CLINICAL DATA:  Cough and congestion. EXAM: CHEST - 2 VIEW COMPARISON:  December 20, 2019 FINDINGS: The heart size and mediastinal contours are within normal limits. Both lungs are clear. The visualized skeletal structures are unremarkable. IMPRESSION: No  active cardiopulmonary disease. Electronically Signed   By: Ted Mcalpine M.D.   On: 12/07/2020 19:39    Procedures Procedures   Medications Ordered in ED Medications - No data to display  ED Course  I have reviewed the triage vital signs and the nursing notes.  Pertinent labs & imaging results that were available during my care of the patient were reviewed by me and considered in my medical decision making (see chart for details).    MDM Rules/Calculators/A&P                           Alert 40 year old male no acute distress, nontoxic-appearing.  Presents to the emergency department with complaint of cough, shortness of breath, and chest pain.  Patient chest pain has been constant since 1130 this morning.  Pain is midsternal and does not radiate.  Pain is described as a pressure.  Denies any associated nausea, vomiting, diaphoresis, shortness of breath, or palpitations.  No leg swelling or tenderness.  Due to patients complaint of chest pain will obtain ACS work-up.  EKG shows sinus rhythm with T wave abnormality in lead III.  Previous T wave abnormalities have resolved. Chest x-ray shows no active cardiopulmonary disease. Troponin 6. With patient symptoms present for greater than 6 hours and negative troponin will suspicion for ACS at this time.  D-dimer within normal limits.  Low suspicion for PE at this time.  Chest x-ray shows no signs of pneumonia.  No pulmonary edema.  Low suspicion for acute CHF at this time.  Suspect that patient's cough is due to bronchitis versus viral upper respiratory infection.  Patient hemodynamically stable.  Will discharge patient at this time.  Patient advised to continue Tessalon, albuterol, and fluticasone.  Patient to follow-up with PCP.  Discussed results, findings, treatment and follow up. Patient advised of return precautions. Patient verbalized understanding and agreed with plan.   Final Clinical Impression(s) / ED Diagnoses Final  diagnoses:  Precordial chest pain  Acute cough  Rx / DC Orders ED Discharge Orders     None        Dyann Ruddle 12/08/20 [id number]    Gareth Morgan, MD 12/08/20 1149

## 2021-06-02 ENCOUNTER — Emergency Department (HOSPITAL_BASED_OUTPATIENT_CLINIC_OR_DEPARTMENT_OTHER): Payer: Medicaid Other

## 2021-06-02 ENCOUNTER — Encounter (HOSPITAL_BASED_OUTPATIENT_CLINIC_OR_DEPARTMENT_OTHER): Payer: Self-pay | Admitting: Radiology

## 2021-06-02 ENCOUNTER — Emergency Department (HOSPITAL_BASED_OUTPATIENT_CLINIC_OR_DEPARTMENT_OTHER)
Admission: EM | Admit: 2021-06-02 | Discharge: 2021-06-02 | Disposition: A | Discharge status: Home / Self Care | Payer: Medicaid Other | Attending: Emergency Medicine | Admitting: Emergency Medicine

## 2021-06-02 DIAGNOSIS — R1031 Right lower quadrant pain: Secondary | ICD-10-CM | POA: Diagnosis not present

## 2021-06-02 DIAGNOSIS — Z79899 Other long term (current) drug therapy: Secondary | ICD-10-CM | POA: Diagnosis not present

## 2021-06-02 DIAGNOSIS — R109 Unspecified abdominal pain: Secondary | ICD-10-CM | POA: Diagnosis present

## 2021-06-02 LAB — COMPREHENSIVE METABOLIC PANEL
ALT: 37 U/L (ref 0–44)
AST: 25 U/L (ref 15–41)
Albumin: 3.8 g/dL (ref 3.5–5.0)
Alkaline Phosphatase: 54 U/L (ref 38–126)
Anion gap: 9 (ref 5–15)
BUN: 17 mg/dL (ref 6–20)
CO2: 27 mmol/L (ref 22–32)
Calcium: 9.2 mg/dL (ref 8.9–10.3)
Chloride: 102 mmol/L (ref 98–111)
Creatinine, Ser: 1.29 mg/dL — ABNORMAL HIGH (ref 0.61–1.24)
GFR, Estimated: >60 mL/min (ref >60)
Glucose, Bld: 94 mg/dL (ref 70–99)
Potassium: 3.7 mmol/L (ref 3.5–5.1)
Sodium: 138 mmol/L (ref 135–145)
Total Bilirubin: 0.8 mg/dL (ref 0.3–1.2)
Total Protein: 6.8 g/dL (ref 6.5–8.1)

## 2021-06-02 LAB — CBC WITH DIFFERENTIAL/PLATELET
Abs Immature Granulocytes: 0.02 10*3/uL (ref 0.00–0.07)
Basophils Absolute: 0 10*3/uL (ref 0.0–0.1)
Basophils Relative: 0 %
Eosinophils Absolute: 0.2 10*3/uL (ref 0.0–0.5)
Eosinophils Relative: 4 %
HCT: 44.2 % (ref 39.0–52.0)
Hemoglobin: 14.3 g/dL (ref 13.0–17.0)
Immature Granulocytes: 0 %
Lymphocytes Relative: 27 %
Lymphs Abs: 1.4 10*3/uL (ref 0.7–4.0)
MCH: 26 pg (ref 26.0–34.0)
MCHC: 32.4 g/dL (ref 30.0–36.0)
MCV: 80.5 fL (ref 80.0–100.0)
Monocytes Absolute: 0.5 10*3/uL (ref 0.1–1.0)
Monocytes Relative: 10 %
Neutro Abs: 3 10*3/uL (ref 1.7–7.7)
Neutrophils Relative %: 59 %
Platelets: 263 10*3/uL (ref 150–400)
RBC: 5.49 MIL/uL (ref 4.22–5.81)
RDW: 14 % (ref 11.5–15.5)
WBC: 5.1 10*3/uL (ref 4.0–10.5)
nRBC: 0 % (ref 0.0–0.2)

## 2021-06-02 LAB — LIPASE, BLOOD: Lipase: 22 U/L (ref 11–51)

## 2021-06-02 MED ORDER — MORPHINE SULFATE (PF) 4 MG/ML IV SOLN
4.0000 mg | Freq: Once | INTRAVENOUS | Status: AC
  Administered 2021-06-02: 4 mg via INTRAVENOUS
  Filled 2021-06-02: qty 1

## 2021-06-02 MED ORDER — ONDANSETRON HCL 4 MG/2ML IJ SOLN
4.0000 mg | Freq: Once | INTRAMUSCULAR | Status: AC
  Administered 2021-06-02: 4 mg via INTRAVENOUS
  Filled 2021-06-02: qty 2

## 2021-06-02 MED ORDER — SODIUM CHLORIDE 0.9 % IV BOLUS
1000.0000 mL | Freq: Once | INTRAVENOUS | Status: AC
  Administered 2021-06-02: 1000 mL via INTRAVENOUS

## 2021-06-02 MED ORDER — DICYCLOMINE HCL 20 MG PO TABS: 20.0000 mg | ORAL_TABLET | Freq: Two times a day (BID) | ORAL | 0 refills | Status: DC

## 2021-06-02 MED ORDER — IOHEXOL 300 MG/ML  SOLN
100.0000 mL | Freq: Once | INTRAMUSCULAR | Status: AC | PRN
  Administered 2021-06-02: 100 mL via INTRAVENOUS

## 2021-06-02 NOTE — ED Triage Notes (Signed)
Pt arrived POV c/o intermittent abdominal pain x 4 days. Endorses nausea, constipation. H/O GERD and IBS, on meds.

## 2021-06-02 NOTE — Discharge Instructions (Signed)
Take 8 scoops of miralax in 32oz of whatever you would like to drink.(Gatorade comes in this size) You can also use a fleets enema which you can buy over the counter at the pharmacy.  Return for worsening abdominal pain, vomiting or fever. ? ?

## 2021-06-02 NOTE — ED Provider Notes (Signed)
[facility] Provider Note   CSN: [id number] Arrival date & time: 06/02/21  0754     History  Chief Complaint  Patient presents with   Abdominal Pain    Glenn Martin is a 41 y.o. male.  41 yo M with a chief complaints of right-sided abdominal discomfort.  Going on for about 3 days now.  No fevers no vomiting.  Symptoms are somewhat consistent of his prior irritable bowel syndrome.  Has been taking his medication for that but without improvement.  Has had progressive worsening and so decided to come here for evaluation.  Denies prior abdominal surgery.   Abdominal Pain     Home Medications Prior to Admission medications   Medication Sig Start Date End Date Taking? Authorizing Provider  dicyclomine (BENTYL) 20 MG tablet Take 1 tablet (20 mg total) by mouth 2 (two) times daily. 06/02/21  Yes Deno Etienne, DO  omeprazole (PRILOSEC) 40 MG capsule Take 40 mg by mouth 2 (two) times daily.  02/04/20  [provider]  albuterol (PROVENTIL HFA;VENTOLIN HFA) 108 (90 Base) MCG/ACT inhaler Inhale 2 puffs into the lungs every 4 (four) hours as needed for wheezing or shortness of breath. 02/18/18   Lorin Glass, PA-C  amLODipine (NORVASC) 5 MG tablet Take 10 mg by mouth daily.     [provider]  amoxicillin-clavulanate (AUGMENTIN) 875-125 MG tablet Take 1 tablet by mouth 2 (two) times daily. 06/18/19   [provider]  atenolol (TENORMIN) 25 MG tablet Take 25 mg by mouth daily.  07/08/17   [provider]  cetirizine (ZYRTEC) 10 MG tablet Take 10 mg by mouth daily.    [provider]  chlorthalidone (HYGROTON) 50 MG tablet Take 50 mg by mouth daily. 07/05/17   [provider]  diclofenac (VOLTAREN) 75 MG EC tablet Take 75 mg by mouth 2 (two) times daily. 05/19/19   [provider]  ezetimibe (ZETIA) 10 MG tablet Take by mouth. 11/12/17   [provider]  gabapentin (NEURONTIN) 300 MG capsule 2  caps in morning, 2 caps at bedtime. 11/18/17   [provider]  lidocaine (XYLOCAINE) 2 % solution Use as directed 15 mLs in the mouth or throat as needed for mouth pain. 02/04/20   Henderly, Britni A, PA-C  linaclotide (LINZESS) 72 MCG capsule Take 72 mcg by mouth daily before breakfast.    [provider]  pantoprazole (PROTONIX) 40 MG tablet Take by mouth. 07/26/18   [provider]  sucralfate (CARAFATE) 1 g tablet Take 1 tablet (1 g total) by mouth 4 (four) times daily -  with meals and at bedtime for 10 days. 02/04/20 02/14/20  Henderly, Britni A, PA-C  vitamin C (ASCORBIC ACID) 500 MG tablet Take 500 mg by mouth daily.    [provider]  Vitamin D, Ergocalciferol, (DRISDOL) 50000 units CAPS capsule Take 50,000 Units by mouth every 7 (seven) days. On mondays    [provider]  glycopyrrolate (ROBINUL) 2 MG tablet Take 2 mg by mouth daily.  02/04/20  [provider]  promethazine (PHENERGAN) 25 MG suppository Place 1 suppository (25 mg total) rectally every 6 (six) hours as needed for nausea or vomiting. 06/30/19 02/04/20  Tegeler, Gwenyth Allegra, MD      Allergies    Atorvastatin, Other, and Shellfish allergy    Review of Systems   Review of Systems  Gastrointestinal:  Positive for abdominal pain.   Physical Exam Updated Vital Signs BP Marland Kitchen)  94/44   Pulse (!) 54   Temp 97.9 F (36.6 C) (Oral)   Resp 18   Ht 5\' 11"  (1.803 m)   Wt (!) 159.2 kg   SpO2 94%   BMI 48.95 kg/m  Physical Exam Vitals and nursing note reviewed.  Constitutional:      Appearance: He is well-developed.  HENT:     Head: Normocephalic and atraumatic.  Eyes:     Pupils: Pupils are equal, round, and reactive to light.  Neck:     Vascular: No JVD.  Cardiovascular:     Rate and Rhythm: Normal rate and regular rhythm.     Heart sounds: No murmur heard.   No friction rub. No gallop.  Pulmonary:     Effort: No respiratory distress.     Breath sounds: No  wheezing.  Abdominal:     General: There is no distension.     Tenderness: There is abdominal tenderness. There is no guarding or rebound.     Comments: Pain worse in the right lower quadrant with rebound.  Musculoskeletal:        General: Normal range of motion.     Cervical back: Normal range of motion and neck supple.  Skin:    Coloration: Skin is not pale.     Findings: No rash.  Neurological:     Mental Status: He is alert and oriented to person, place, and time.  Psychiatric:        Behavior: Behavior normal.    ED Results / Procedures / Treatments   Labs (all labs ordered are listed, but only abnormal results are displayed) Labs Reviewed  COMPREHENSIVE METABOLIC PANEL - Abnormal; Notable for the following components:      Result Value   Creatinine, Ser 1.29 (*)    All other components within normal limits  CBC WITH DIFFERENTIAL/PLATELET  LIPASE, BLOOD    EKG None  Radiology CT ABDOMEN PELVIS W CONTRAST  Result Date: 06/02/2021 CLINICAL DATA:  Right lower quadrant pain and nausea for 4 days. EXAM: CT ABDOMEN AND PELVIS WITH CONTRAST TECHNIQUE: Multidetector CT imaging of the abdomen and pelvis was performed using the standard protocol following bolus administration of intravenous contrast. RADIATION DOSE REDUCTION: This exam was performed according to the departmental dose-optimization program which includes automated exposure control, adjustment of the mA and/or kV according to patient size and/or use of iterative reconstruction technique. CONTRAST:  160mL OMNIPAQUE IOHEXOL 300 MG/ML  SOLN COMPARISON:  02/04/2020 FINDINGS: Lower Chest: No acute findings. Hepatobiliary: No hepatic masses identified. Gallbladder is unremarkable. No evidence of biliary ductal dilatation. Pancreas:  No mass or inflammatory changes. Spleen: Within normal limits in size and appearance. Adrenals/Urinary Tract: No masses identified. No evidence of ureteral calculi or hydronephrosis. Stomach/Bowel:  No evidence of obstruction, inflammatory process or abnormal fluid collections. No signs of appendicitis identified. Vascular/Lymphatic: No pathologically enlarged lymph nodes. No acute vascular findings. Reproductive:  No mass or other significant abnormality. Other:  None. Musculoskeletal:  No suspicious bone lesions identified. IMPRESSION: Negative. No evidence of appendicitis or other significant abnormality. Electronically Signed   By: Marlaine Hind M.D.   On: 06/02/2021 09:59    Procedures Procedures    Medications Ordered in ED Medications  sodium chloride 0.9 % bolus 1,000 mL (0 mLs Intravenous Stopped 06/02/21 1032)  morphine (PF) 4 MG/ML injection 4 mg (4 mg Intravenous Given 06/02/21 0846)  ondansetron (ZOFRAN) injection 4 mg (4 mg Intravenous Given 06/02/21 0845)  iohexol (OMNIPAQUE) 300 MG/ML solution 100 mL (  100 mLs Intravenous Contrast Given 06/02/21 [id number])    ED Course/ Medical Decision Making/ A&P                           Medical Decision Making Amount and/or Complexity of Data Reviewed Labs: ordered. Radiology: ordered.  Risk Prescription drug management.   41 yo M with a chief complaints of right lower quadrant abdominal pain.  Going on for about 3 days now.  He describes symptoms consistent with irritable bowel syndrome pencil thin stools difficulty having bowel movements.  He also is experiencing some subjective chills and myalgias and has an exam concerning for possible appendicitis.  Will obtain a laboratory evaluation bolus of IV fluids treat pain and nausea CT scan of the abdomen pelvis.  Laboratory evaluation without anemia no leukocytosis LFTs and lipase are unremarkable.  CT scan of the abdomen pelvis independently interpreted by me without obvious appendicitis.  Radiology read is negative.  We will discharge the patient home.  Trial of MiraLAX.  GI follow-up.  10:41 AM:  I have discussed the diagnosis/risks/treatment options with the patient.  Evaluation and  diagnostic testing in the emergency department does not suggest an emergent condition requiring admission or immediate intervention beyond what has been performed at this time.  They will follow up with  PCP. We also discussed returning to the ED immediately if new or worsening sx occur. We discussed the sx which are most concerning (e.g., sudden worsening pain, fever, inability to tolerate by mouth) that necessitate immediate return. Medications administered to the patient during their visit and any new prescriptions provided to the patient are listed below.  Medications given during this visit Medications  sodium chloride 0.9 % bolus 1,000 mL (0 mLs Intravenous Stopped 06/02/21 1032)  morphine (PF) 4 MG/ML injection 4 mg (4 mg Intravenous Given 06/02/21 0846)  ondansetron (ZOFRAN) injection 4 mg (4 mg Intravenous Given 06/02/21 0845)  iohexol (OMNIPAQUE) 300 MG/ML solution 100 mL (100 mLs Intravenous Contrast Given 06/02/21 [id number])     The patient appears reasonably screen and/or stabilized for discharge and I doubt any other medical condition or other [facility] requiring further screening, evaluation, or treatment in the ED at this time prior to discharge.          Final Clinical Impression(s) / ED Diagnoses Final diagnoses:  Right lower quadrant abdominal pain    Rx / DC Orders ED Discharge Orders          Ordered    dicyclomine (BENTYL) 20 MG tablet  2 times daily        06/02/21 Downey, Delano Scardino, DO 06/02/21 1041

## 2021-08-31 ENCOUNTER — Other Ambulatory Visit: Payer: Self-pay

## 2021-08-31 ENCOUNTER — Encounter (HOSPITAL_BASED_OUTPATIENT_CLINIC_OR_DEPARTMENT_OTHER): Payer: Self-pay | Admitting: Emergency Medicine

## 2021-08-31 ENCOUNTER — Emergency Department (HOSPITAL_BASED_OUTPATIENT_CLINIC_OR_DEPARTMENT_OTHER)
Admission: EM | Admit: 2021-08-31 | Discharge: 2021-08-31 | Disposition: A | Discharge status: Home / Self Care | Payer: Medicaid Other | Attending: Emergency Medicine | Admitting: Emergency Medicine

## 2021-08-31 DIAGNOSIS — Z20822 Contact with and (suspected) exposure to covid-19: Secondary | ICD-10-CM | POA: Insufficient documentation

## 2021-08-31 DIAGNOSIS — R001 Bradycardia, unspecified: Secondary | ICD-10-CM | POA: Insufficient documentation

## 2021-08-31 DIAGNOSIS — B349 Viral infection, unspecified: Secondary | ICD-10-CM | POA: Diagnosis not present

## 2021-08-31 DIAGNOSIS — H9201 Otalgia, right ear: Secondary | ICD-10-CM | POA: Diagnosis present

## 2021-08-31 LAB — SARS CORONAVIRUS 2 BY RT PCR: SARS Coronavirus 2 by RT PCR: NEGATIVE

## 2021-08-31 NOTE — Discharge Instructions (Signed)
Take tylenol 2 pills 4 times a day and motrin 4 pills 3 times a day.  Drink plenty of fluids.  Return for worsening shortness of breath, headache, confusion. Follow up with your family doctor.   

## 2021-08-31 NOTE — ED Triage Notes (Signed)
Pt states he has felt generally unwell for the last several days. Intermittent HA and ear pain, "tasting mucous", "sporatic chest pains" in center of chest, and felt like he was wheezing. No fever, n/v/d. Does endorse epigastric/abd pain that also comes and goes. Pt states seems unrelated to food intake.

## 2021-08-31 NOTE — ED Provider Notes (Signed)
[facility] EMERGENCY DEPARTMENT Provider Note   CSN: [id number] Arrival date & time: 08/31/21  0444     History  Chief Complaint  Patient presents with   multiple complaints    Glenn Martin is a 41 y.o. male.  41 yo M with a cc of feeling crummy.  He has felt a bit congested has had some fullness in the right ear as felt like his abdomen was upset from time to time.  His daughter is also been sick.  He feels like her symptoms are different than his.  No fevers or chills no cough.  No sore throat.  Denies recent travel.  Denies recent tick bite.  Denies rash.        Home Medications Prior to Admission medications   Medication Sig Start Date End Date Taking? Authorizing Provider  omeprazole (PRILOSEC) 40 MG capsule Take 40 mg by mouth 2 (two) times daily.  02/04/20  [provider]  albuterol (PROVENTIL HFA;VENTOLIN HFA) 108 (90 Base) MCG/ACT inhaler Inhale 2 puffs into the lungs every 4 (four) hours as needed for wheezing or shortness of breath. 02/18/18   Cristina Gong, PA-C  amLODipine (NORVASC) 5 MG tablet Take 10 mg by mouth daily.     [provider]  amoxicillin-clavulanate (AUGMENTIN) 875-125 MG tablet Take 1 tablet by mouth 2 (two) times daily. 06/18/19   [provider]  atenolol (TENORMIN) 25 MG tablet Take 25 mg by mouth daily.  07/08/17   [provider]  cetirizine (ZYRTEC) 10 MG tablet Take 10 mg by mouth daily.    [provider]  chlorthalidone (HYGROTON) 50 MG tablet Take 50 mg by mouth daily. 07/05/17   [provider]  diclofenac (VOLTAREN) 75 MG EC tablet Take 75 mg by mouth 2 (two) times daily. 05/19/19   [provider]  dicyclomine (BENTYL) 20 MG tablet Take 1 tablet (20 mg total) by mouth 2 (two) times daily. 06/02/21   Melene Plan, DO  ezetimibe (ZETIA) 10 MG tablet Take by mouth. 11/12/17   [provider]  gabapentin (NEURONTIN) 300 MG capsule 2 caps in morning, 2 caps at  bedtime. 11/18/17   [provider]  lidocaine (XYLOCAINE) 2 % solution Use as directed 15 mLs in the mouth or throat as needed for mouth pain. 02/04/20   Henderly, Britni A, PA-C  linaclotide (LINZESS) 72 MCG capsule Take 72 mcg by mouth daily before breakfast.    [provider]  pantoprazole (PROTONIX) 40 MG tablet Take by mouth. 07/26/18   [provider]  sucralfate (CARAFATE) 1 g tablet Take 1 tablet (1 g total) by mouth 4 (four) times daily -  with meals and at bedtime for 10 days. 02/04/20 02/14/20  Henderly, Britni A, PA-C  vitamin C (ASCORBIC ACID) 500 MG tablet Take 500 mg by mouth daily.    [provider]  Vitamin D, Ergocalciferol, (DRISDOL) 50000 units CAPS capsule Take 50,000 Units by mouth every 7 (seven) days. On mondays    [provider]  glycopyrrolate (ROBINUL) 2 MG tablet Take 2 mg by mouth daily.  02/04/20  [provider]  promethazine (PHENERGAN) 25 MG suppository Place 1 suppository (25 mg total) rectally every 6 (six) hours as needed for nausea or vomiting. 06/30/19 02/04/20  Tegeler, Canary Brim, MD      Allergies    Atorvastatin, Other, and Shellfish allergy    Review of Systems   Review of Systems  Physical Exam Updated Vital Signs  BP (!) 160/101 (BP Location: Right Wrist)   Pulse 60   Temp 97.7 F (36.5 C) (Oral)   Resp 18   SpO2 95%  Physical Exam Vitals and nursing note reviewed.  Constitutional:      Appearance: He is well-developed.  HENT:     Head: Normocephalic and atraumatic.     Comments: Swollen turbinates, posterior nasal drip,  tm normal bilaterally.   Eyes:     Pupils: Pupils are equal, round, and reactive to light.  Neck:     Vascular: No JVD.  Cardiovascular:     Rate and Rhythm: Normal rate and regular rhythm.     Heart sounds: No murmur heard.    No friction rub. No gallop.  Pulmonary:     Effort: No respiratory distress.     Breath sounds: No wheezing.  Abdominal:     General:  There is no distension.     Tenderness: There is no abdominal tenderness. There is no guarding or rebound.  Musculoskeletal:        General: Normal range of motion.     Cervical back: Normal range of motion and neck supple.  Skin:    Coloration: Skin is not pale.     Findings: No rash.  Neurological:     Mental Status: He is alert and oriented to person, place, and time.  Psychiatric:        Behavior: Behavior normal.     ED Results / Procedures / Treatments   Labs (all labs ordered are listed, but only abnormal results are displayed) Labs Reviewed  SARS CORONAVIRUS 2 BY RT PCR    EKG EKG Interpretation  Date/Time:  Sunday August 31 2021 04:52:09 EDT Ventricular Rate:  57 PR Interval:  154 QRS Duration: 104 QT Interval:  392 QTC Calculation: 381 R Axis:   36 Text Interpretation: Sinus bradycardia Cannot rule out Anterior infarct , age undetermined Abnormal ECG No significant change since last tracing Confirmed by Melene Plan [phone]) on 08/31/2021 7:14:34 AM  Radiology No results found.  Procedures Procedures    Medications Ordered in ED Medications - No data to display  ED Course/ Medical Decision Making/ A&P                           Medical Decision Making  41 yo M with a chief complaints of not feeling well.  By history and exam patient most likely has an upper respiratory illness.  Daughter is also been sick with something similar.  Well-appearing nontoxic.  No bacterial source was found on exam.  We will treat supportively.  PCP follow-up.  7:31 AM:  I have discussed the diagnosis/risks/treatment options with the patient.  Evaluation and diagnostic testing in the emergency department does not suggest an emergent condition requiring admission or immediate intervention beyond what has been performed at this time.  They will follow up with  PCP. We also discussed returning to the ED immediately if new or worsening sx occur. We discussed the sx which are most  concerning (e.g., sudden worsening pain, fever, inability to tolerate by mouth) that necessitate immediate return. Medications administered to the patient during their visit and any new prescriptions provided to the patient are listed below.  Medications given during this visit Medications - No data to display   The patient appears reasonably screen and/or stabilized for discharge and I doubt any other medical condition or other [facility] requiring further screening, evaluation, or treatment  in the ED at this time prior to discharge.           Final Clinical Impression(s) / ED Diagnoses Final diagnoses:  Acute viral syndrome    Rx / DC Orders ED Discharge Orders     None         Melene Plan, DO 08/31/21 [phone]

## 2022-01-18 ENCOUNTER — Emergency Department (HOSPITAL_BASED_OUTPATIENT_CLINIC_OR_DEPARTMENT_OTHER): Payer: Medicaid Other

## 2022-01-18 ENCOUNTER — Other Ambulatory Visit: Payer: Self-pay

## 2022-01-18 ENCOUNTER — Emergency Department (HOSPITAL_BASED_OUTPATIENT_CLINIC_OR_DEPARTMENT_OTHER)
Admission: EM | Admit: 2022-01-18 | Discharge: 2022-01-18 | Disposition: A | Discharge status: Home / Self Care | Payer: Medicaid Other | Attending: Emergency Medicine | Admitting: Emergency Medicine

## 2022-01-18 ENCOUNTER — Encounter (HOSPITAL_BASED_OUTPATIENT_CLINIC_OR_DEPARTMENT_OTHER): Payer: Self-pay | Admitting: Emergency Medicine

## 2022-01-18 DIAGNOSIS — I10 Essential (primary) hypertension: Secondary | ICD-10-CM | POA: Diagnosis not present

## 2022-01-18 DIAGNOSIS — R109 Unspecified abdominal pain: Secondary | ICD-10-CM | POA: Insufficient documentation

## 2022-01-18 LAB — COMPREHENSIVE METABOLIC PANEL
ALT: 33 U/L (ref 0–44)
AST: 23 U/L (ref 15–41)
Albumin: 4 g/dL (ref 3.5–5.0)
Alkaline Phosphatase: 65 U/L (ref 38–126)
Anion gap: 7 (ref 5–15)
BUN: 16 mg/dL (ref 6–20)
CO2: 28 mmol/L (ref 22–32)
Calcium: 8.9 mg/dL (ref 8.9–10.3)
Chloride: 103 mmol/L (ref 98–111)
Creatinine, Ser: 1.25 mg/dL — ABNORMAL HIGH (ref 0.61–1.24)
GFR, Estimated: >60 mL/min (ref >60)
Glucose, Bld: 101 mg/dL — ABNORMAL HIGH (ref 70–99)
Potassium: 4.3 mmol/L (ref 3.5–5.1)
Sodium: 138 mmol/L (ref 135–145)
Total Bilirubin: 0.5 mg/dL (ref 0.3–1.2)
Total Protein: 7.3 g/dL (ref 6.5–8.1)

## 2022-01-18 LAB — CBC WITH DIFFERENTIAL/PLATELET
Abs Immature Granulocytes: 0.02 10*3/uL (ref 0.00–0.07)
Basophils Absolute: 0 10*3/uL (ref 0.0–0.1)
Basophils Relative: 1 %
Eosinophils Absolute: 0.2 10*3/uL (ref 0.0–0.5)
Eosinophils Relative: 4 %
HCT: 43.9 % (ref 39.0–52.0)
Hemoglobin: 14 g/dL (ref 13.0–17.0)
Immature Granulocytes: 0 %
Lymphocytes Relative: 29 %
Lymphs Abs: 1.5 10*3/uL (ref 0.7–4.0)
MCH: 26 pg (ref 26.0–34.0)
MCHC: 31.9 g/dL (ref 30.0–36.0)
MCV: 81.4 fL (ref 80.0–100.0)
Monocytes Absolute: 0.5 10*3/uL (ref 0.1–1.0)
Monocytes Relative: 9 %
Neutro Abs: 3 10*3/uL (ref 1.7–7.7)
Neutrophils Relative %: 57 %
Platelets: 234 10*3/uL (ref 150–400)
RBC: 5.39 MIL/uL (ref 4.22–5.81)
RDW: 14.6 % (ref 11.5–15.5)
WBC: 5.2 10*3/uL (ref 4.0–10.5)
nRBC: 0 % (ref 0.0–0.2)

## 2022-01-18 LAB — LIPASE, BLOOD: Lipase: 30 U/L (ref 11–51)

## 2022-01-18 MED ORDER — IOHEXOL 300 MG/ML  SOLN
100.0000 mL | Freq: Once | INTRAMUSCULAR | Status: AC | PRN
  Administered 2022-01-18: 100 mL via INTRAVENOUS

## 2022-01-18 MED ORDER — FENTANYL CITRATE PF 50 MCG/ML IJ SOSY: 50.0000 ug | PREFILLED_SYRINGE | Freq: Once | INTRAMUSCULAR | Status: DC

## 2022-01-18 MED ORDER — ONDANSETRON HCL 4 MG/2ML IJ SOLN
4.0000 mg | Freq: Once | INTRAMUSCULAR | Status: AC
  Administered 2022-01-18: 4 mg via INTRAVENOUS
  Filled 2022-01-18: qty 2

## 2022-01-18 MED ORDER — ONDANSETRON HCL 4 MG PO TABS: 4.0000 mg | ORAL_TABLET | Freq: Four times a day (QID) | ORAL | 0 refills | Status: DC

## 2022-01-18 NOTE — ED Provider Notes (Signed)
[facility] EMERGENCY DEPARTMENT Provider Note   CSN: [id number] Arrival date & time: 01/18/22  0744     History  Chief Complaint  Patient presents with   Abdominal Pain    Glenn Martin is a 42 y.o. male.  Patient here with abdominal pain acute on chronic.  Pain for the last several days.  Recently saw his GI doctor started him on some new medication.  He is got IBS history.  He is on acid reflux medications, IBS medications.  They recently started a new medicine to help with his discomfort called doxepin.  He denies any fevers or chills.  Denies any diarrhea.  Pain mostly in the lower abdomen.  Nothing makes it worse or better.  Denies any chest pain, shortness of breath, numbness, weakness.  The history is provided by the patient.       Home Medications Prior to Admission medications   Medication Sig Start Date End Date Taking? Authorizing Provider  ondansetron (ZOFRAN) 4 MG tablet Take 1 tablet (4 mg total) by mouth every 6 (six) hours. 01/18/22  Yes Andersen Iorio, DO  omeprazole (PRILOSEC) 40 MG capsule Take 40 mg by mouth 2 (two) times daily.  02/04/20  [provider]  albuterol (PROVENTIL HFA;VENTOLIN HFA) 108 (90 Base) MCG/ACT inhaler Inhale 2 puffs into the lungs every 4 (four) hours as needed for wheezing or shortness of breath. 02/18/18   Lorin Glass, PA-C  amLODipine (NORVASC) 5 MG tablet Take 10 mg by mouth daily.     [provider]  amoxicillin-clavulanate (AUGMENTIN) 875-125 MG tablet Take 1 tablet by mouth 2 (two) times daily. 06/18/19   [provider]  atenolol (TENORMIN) 25 MG tablet Take 25 mg by mouth daily.  07/08/17   [provider]  cetirizine (ZYRTEC) 10 MG tablet Take 10 mg by mouth daily.    [provider]  chlorthalidone (HYGROTON) 50 MG tablet Take 50 mg by mouth daily. 07/05/17   [provider]  diclofenac (VOLTAREN) 75 MG EC tablet Take 75 mg by mouth 2 (two) times daily. 05/19/19    [provider]  dicyclomine (BENTYL) 20 MG tablet Take 1 tablet (20 mg total) by mouth 2 (two) times daily. 06/02/21   Deno Etienne, DO  ezetimibe (ZETIA) 10 MG tablet Take by mouth. 11/12/17   [provider]  gabapentin (NEURONTIN) 300 MG capsule 2 caps in morning, 2 caps at bedtime. 11/18/17   [provider]  lidocaine (XYLOCAINE) 2 % solution Use as directed 15 mLs in the mouth or throat as needed for mouth pain. 02/04/20   Henderly, Britni A, PA-C  linaclotide (LINZESS) 72 MCG capsule Take 72 mcg by mouth daily before breakfast.    [provider]  pantoprazole (PROTONIX) 40 MG tablet Take by mouth. 07/26/18   [provider]  sucralfate (CARAFATE) 1 g tablet Take 1 tablet (1 g total) by mouth 4 (four) times daily -  with meals and at bedtime for 10 days. 02/04/20 02/14/20  Henderly, Britni A, PA-C  vitamin C (ASCORBIC ACID) 500 MG tablet Take 500 mg by mouth daily.    [provider]  Vitamin D, Ergocalciferol, (DRISDOL) 50000 units CAPS capsule Take 50,000 Units by mouth every 7 (seven) days. On mondays    [provider]  glycopyrrolate (ROBINUL) 2 MG tablet Take 2 mg by mouth daily.  02/04/20  [provider]  promethazine (PHENERGAN) 25 MG suppository Place 1 suppository (25 mg total) rectally every  6 (six) hours as needed for nausea or vomiting. 06/30/19 02/04/20  Tegeler, Canary Brim, MD      Allergies    Atorvastatin, Other, Shellfish allergy, and Allopurinol    Review of Systems   Review of Systems  Physical Exam Updated Vital Signs BP (!) 151/127   Pulse (!) 57   Temp 98.5 F (36.9 C) (Oral)   Resp 10   Ht 5\' 11"  (1.803 m)   Wt (!) 158.8 kg   SpO2 97%   BMI 48.82 kg/m  Physical Exam Vitals and nursing note reviewed.  Constitutional:      General: He is not in acute distress.    Appearance: He is well-developed. He is not ill-appearing.  HENT:     Head: Normocephalic and atraumatic.     Nose: Nose  normal.  Eyes:     Extraocular Movements: Extraocular movements intact.     Conjunctiva/sclera: Conjunctivae normal.     Pupils: Pupils are equal, round, and reactive to light.  Cardiovascular:     Rate and Rhythm: Normal rate and regular rhythm.     Pulses: Normal pulses.     Heart sounds: Normal heart sounds. No murmur heard. Pulmonary:     Effort: Pulmonary effort is normal. No respiratory distress.     Breath sounds: Normal breath sounds.  Abdominal:     Palpations: Abdomen is soft.     Tenderness: There is abdominal tenderness.  Musculoskeletal:        General: No swelling.     Cervical back: Normal range of motion and neck supple.  Skin:    General: Skin is warm and dry.     Capillary Refill: Capillary refill takes less than 2 seconds.  Neurological:     General: No focal deficit present.     Mental Status: He is alert.  Psychiatric:        Mood and Affect: Mood normal.     ED Results / Procedures / Treatments   Labs (all labs ordered are listed, but only abnormal results are displayed) Labs Reviewed  COMPREHENSIVE METABOLIC PANEL - Abnormal; Notable for the following components:      Result Value   Glucose, Bld 101 (*)    Creatinine, Ser 1.25 (*)    All other components within normal limits  CBC WITH DIFFERENTIAL/PLATELET  LIPASE, BLOOD    EKG EKG Interpretation  Date/Time:  Sunday January 18 2022 08:11:58 EST Ventricular Rate:  61 PR Interval:  170 QRS Duration: 105 QT Interval:  388 QTC Calculation: 391 R Axis:   63 Text Interpretation: Sinus rhythm Confirmed by [id number], Tejon Gracie ([id number]) on 01/18/2022 8:51:01 AM  Radiology CT ABDOMEN PELVIS W CONTRAST  Result Date: 01/18/2022 CLINICAL DATA:  Acute non localized abdominal pain.  Nausea. EXAM: CT ABDOMEN AND PELVIS WITH CONTRAST TECHNIQUE: Multidetector CT imaging of the abdomen and pelvis was performed using the standard protocol following bolus administration of intravenous contrast. RADIATION DOSE  REDUCTION: This exam was performed according to the departmental dose-optimization program which includes automated exposure control, adjustment of the mA and/or kV according to patient size and/or use of iterative reconstruction technique. CONTRAST:  01/20/2022 OMNIPAQUE IOHEXOL 300 MG/ML  SOLN COMPARISON:  06/02/2021 FINDINGS: Lower Chest: No acute findings. Hepatobiliary: No hepatic masses identified. Gallbladder is unremarkable. No evidence of biliary ductal dilatation. Pancreas:  No mass or inflammatory changes. Spleen: Within normal limits in size and appearance. Adrenals/Urinary Tract: No suspicious masses identified. No evidence of ureteral calculi or hydronephrosis. Stomach/Bowel: No evidence  of obstruction, inflammatory process or abnormal fluid collections. Normal appendix visualized. Vascular/Lymphatic: No pathologically enlarged lymph nodes. No acute vascular findings. Reproductive:  No mass or other significant abnormality. Other:  None. Musculoskeletal: No suspicious bone lesions identified. Intramedullary screws again seen in both hips. IMPRESSION: Negative.  No acute findings or other significant abnormality. Electronically Signed   By: Danae Orleans M.D.   On: 01/18/2022 09:43   DG Chest Portable 1 View  Result Date: 01/18/2022 CLINICAL DATA:  42 year old male with history of cough. EXAM: PORTABLE CHEST 1 VIEW COMPARISON:  Chest x-ray 12/07/2020. FINDINGS: Lung volumes are normal. No consolidative airspace disease. No pleural effusions. No evidence of pulmonary edema. Heart size appears borderline enlarged, likely accentuated by portable AP technique and lordotic positioning. Upper mediastinal contours are within normal limits. IMPRESSION: 1. No radiographic evidence of acute cardiopulmonary disease. Electronically Signed   By: Trudie Reed M.D.   On: 01/18/2022 08:40    Procedures Procedures    Medications Ordered in ED Medications  ondansetron (ZOFRAN) injection 4 mg (4 mg Intravenous  Given 01/18/22 0838)  iohexol (OMNIPAQUE) 300 MG/ML solution 100 mL (100 mLs Intravenous Contrast Given 01/18/22 0908)    ED Course/ Medical Decision Making/ A&P                             Medical Decision Making Amount and/or Complexity of Data Reviewed Labs: ordered. Radiology: ordered.  Risk Prescription drug management.   Simonne Martinet is here with mostly upper abdominal pain that radiates down into his lower abdomen.  History of IBS, hypertension, high cholesterol, acid reflux, obesity.  He recently was started on a new medication for his IBS but has not filled the prescription yet.  He has been on multiple medications including Linzess and now on doxepin and Amitiza.  He is having a little bit worsening pain in the upper abdomen has had a cough.  He is kind of tender diffusely on exam but overall is well-appearing.  Differential diagnosis possibly IBS flare versus may be pneumonia, less likely bowel obstruction cardiac issue or other pulmonary issue.  Will get CBC, CMP, lipase, urinalysis, chest x-ray, EKG, CT abdomen and pelvis to further evaluate.  He was scheduled to have a CT scan of his abdomen pelvis outpatient by his gastroenterologist saw him a couple days ago.  Will just pursue that workup now.  Overall appears well with normal vitals.  Per my review interpretation of labs is no acute findings.  No significant anemia or electrolyte abnormality or kidney injury.  CT scan abdomen pelvis unremarkable.  Chest x-ray per my review interpretation shows no evidence of pneumonia.  EKG shows sinus rhythm.  No ischemic changes.  Overall suspect chronic abdominal process ongoing.  Recommend that he start his new medication and will prescribe Zofran as needed.  Discharged in good condition.  This chart was dictated using voice recognition software.  Despite best efforts to proofread,  errors can occur which can change the documentation meaning.         Final Clinical Impression(s) / ED  Diagnoses Final diagnoses:  Abdominal pain, unspecified abdominal location    Rx / DC Orders ED Discharge Orders          Ordered    ondansetron (ZOFRAN) 4 MG tablet  Every 6 hours        01/18/22 0957              Virgina Norfolk,  DO 01/18/22 1696

## 2022-01-18 NOTE — ED Triage Notes (Signed)
Pt arrives pov, steady gait, c/o epigastric pain and CP intermittently since 12/14. States pain has began to radiated to neck, reports nausea. Endorses 1 g tylenol at 0500

## 2022-01-18 NOTE — ED Notes (Signed)
ED Provider at bedside. 

## 2022-01-18 NOTE — Discharge Instructions (Signed)
Workup today is all.  Follow-up with your primary care doctor and GI doctor.  I recommend that you start the new medication they recommended.  I prescribed you Zofran to use as needed as well.

## 2022-10-04 IMAGING — CT CT ABD-PELV W/ CM
2 of 5 series · 17 of 46 positions shown, 19 images · IV contrast (agent unspecified)
Comparison: 02/04/2020

CLINICAL DATA: Right lower quadrant pain and nausea for 4 days.

EXAM:
CT ABDOMEN AND PELVIS WITH CONTRAST
TECHNIQUE: Multidetector CT imaging of the abdomen and pelvis was performed
using the standard protocol following bolus administration of
intravenous contrast.

[Series 2: axial st · axial · 0.98mm/px · z∈[-530,-75]mm · 14 of 103 slices shown, 16 images]
[im 6/103  soft-tissue]
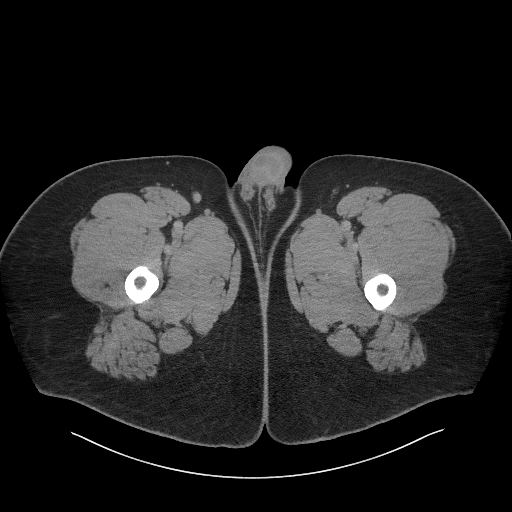
[im 6/103  bone]
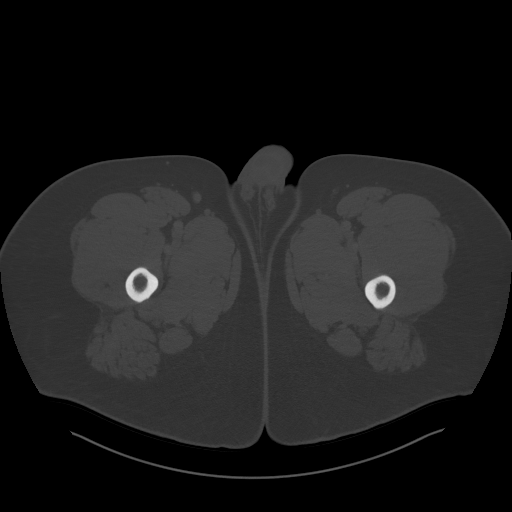
[im 11/103  soft-tissue]
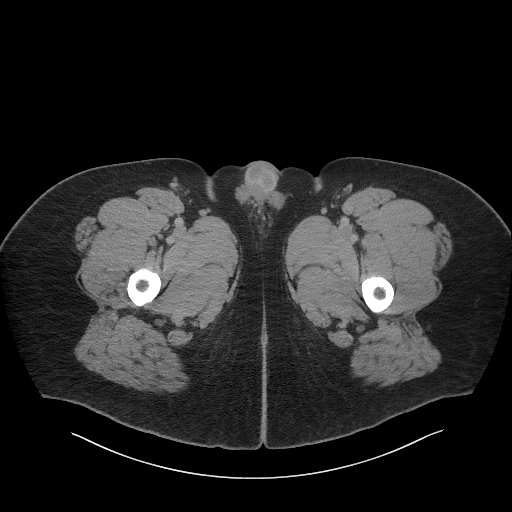
[im 22/103  soft-tissue]
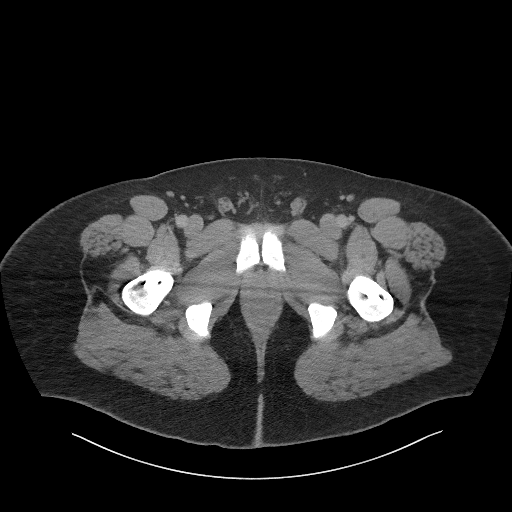
[im 27/103  soft-tissue]
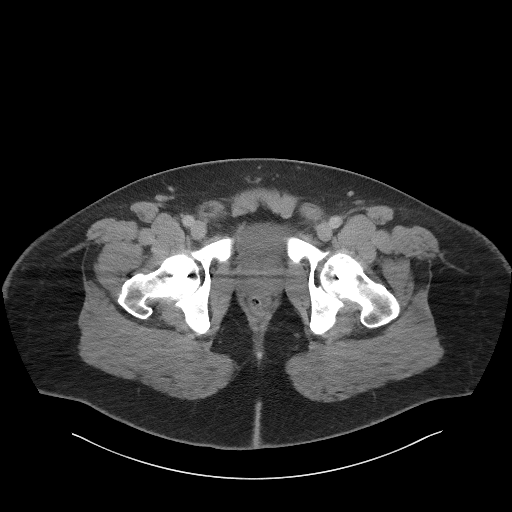
[im 33/103  soft-tissue]
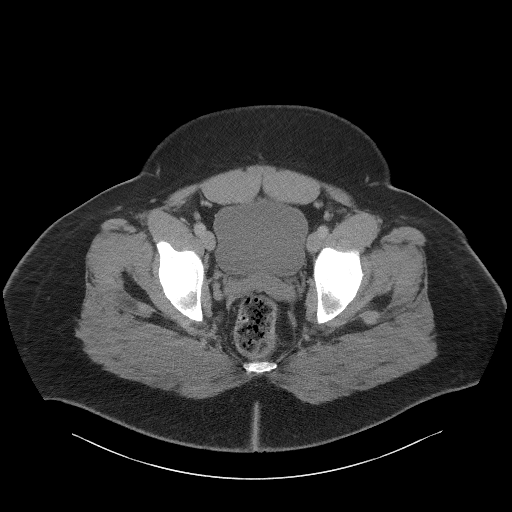
[im 43/103  soft-tissue]
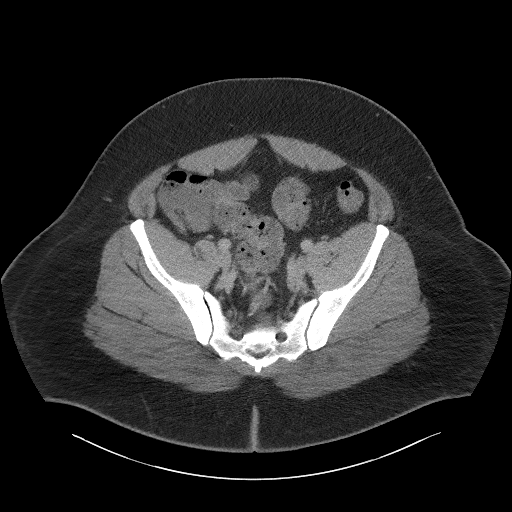
[im 49/103  soft-tissue]
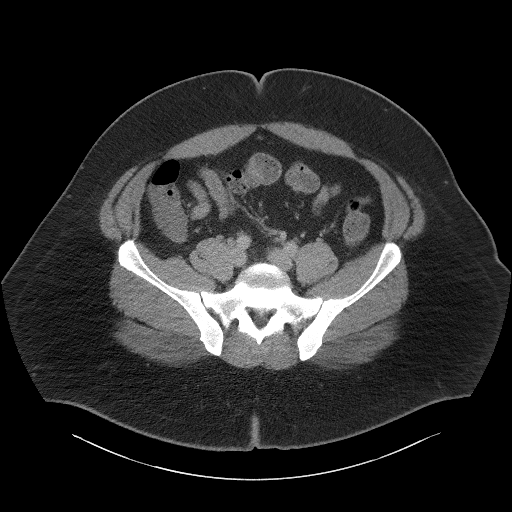
[im 54/103  soft-tissue]
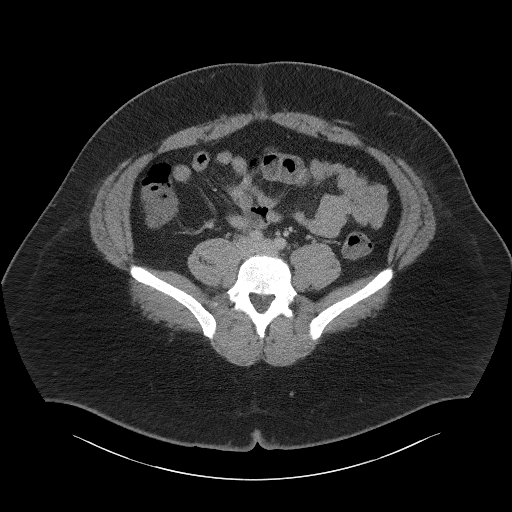
[im 60/103  soft-tissue]
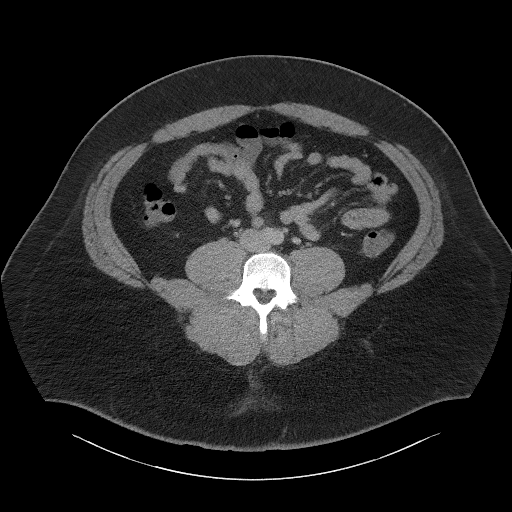
[im 60/103  bone]
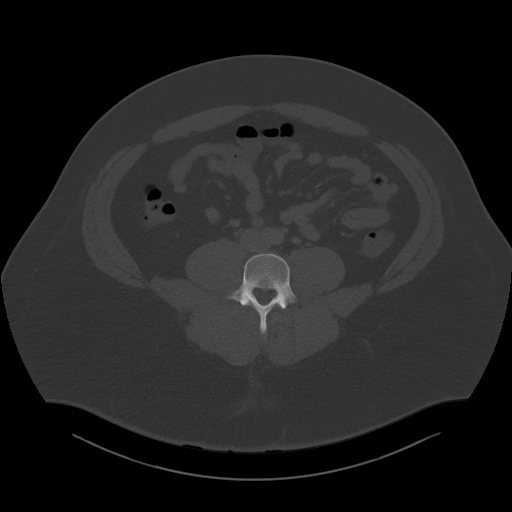
[im 70/103  soft-tissue]
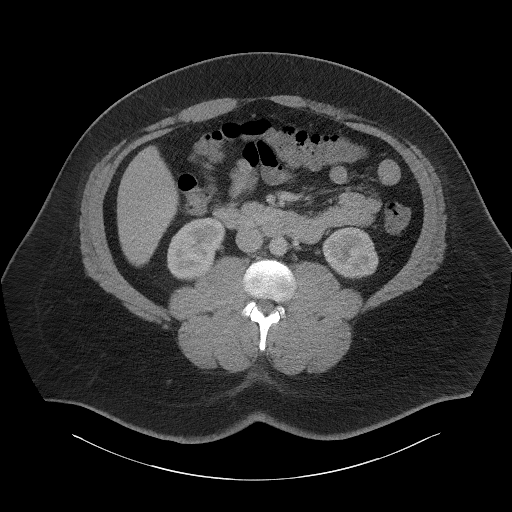
[im 76/103  soft-tissue]
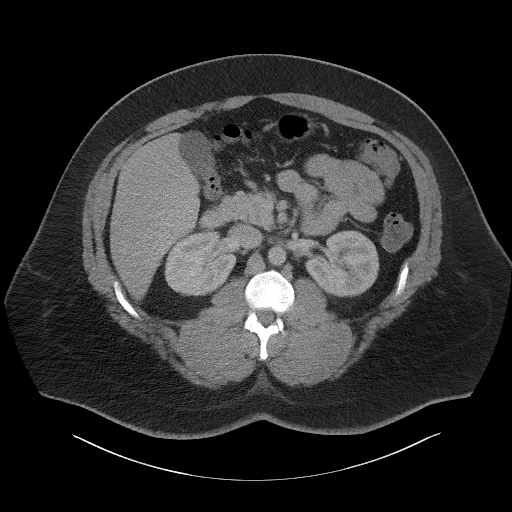
[im 81/103  soft-tissue]
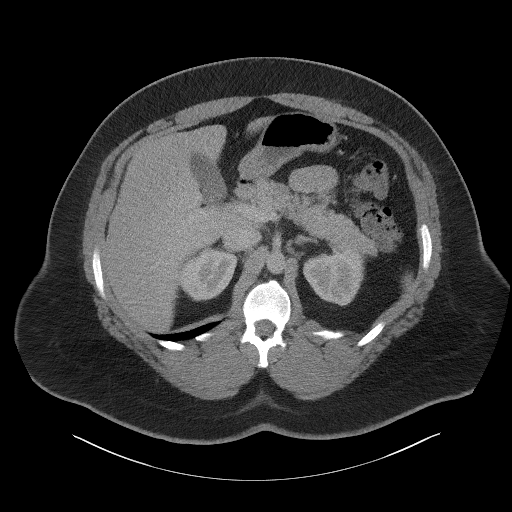
[im 92/103  soft-tissue]
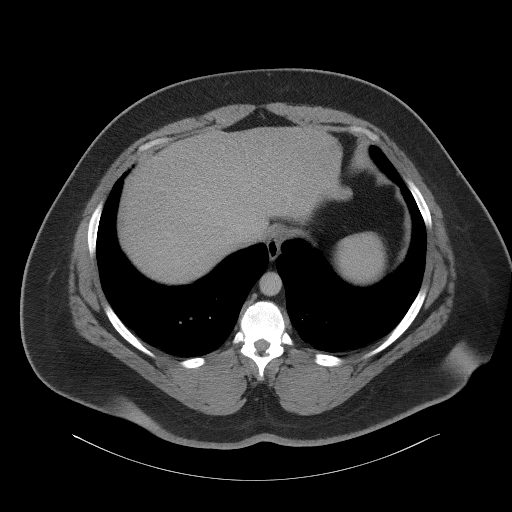
[im 97/103  soft-tissue]
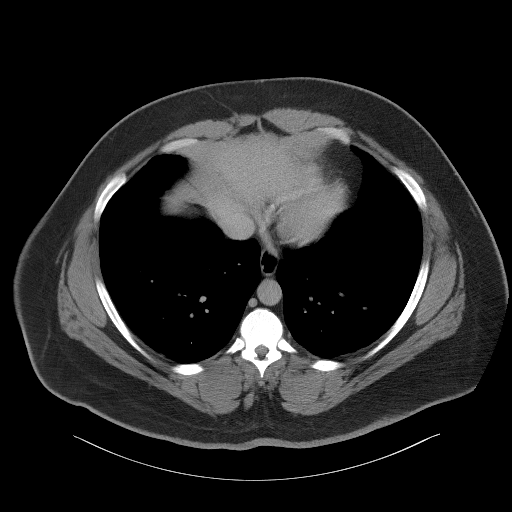

[Series 5: coronal st · coronal · 1.03mm/px · 3 of 129 slices shown]
[im 43/129  soft-tissue]
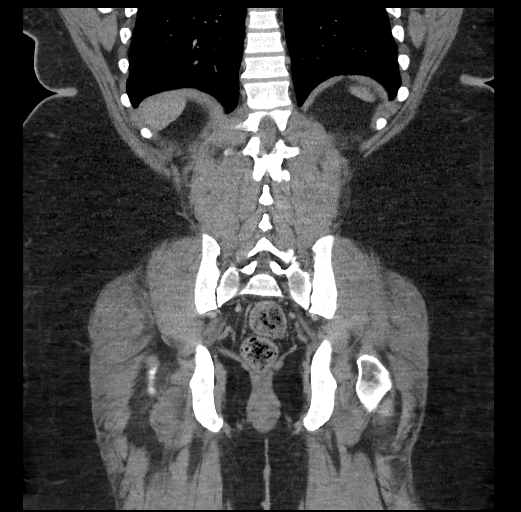
[im 57/129  soft-tissue]
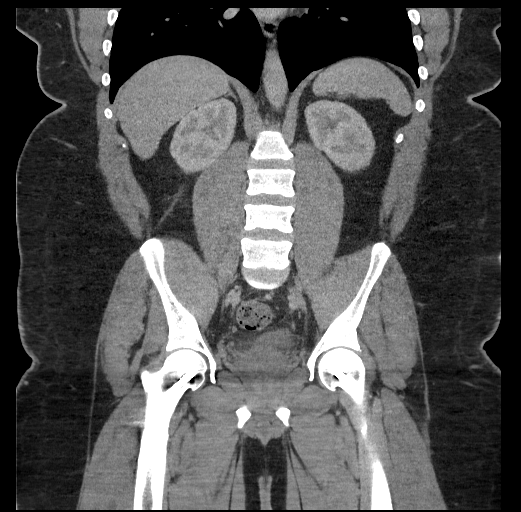
[im 72/129  soft-tissue]
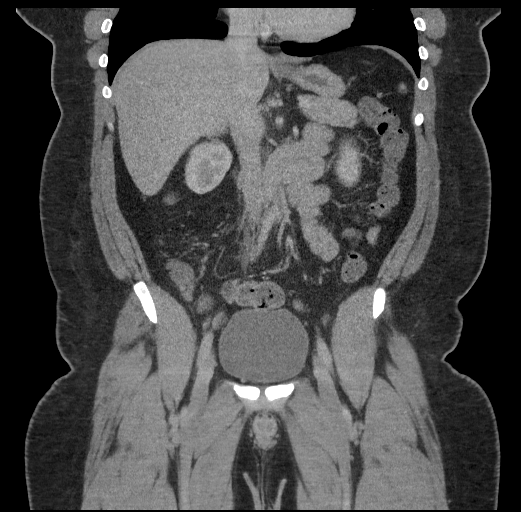

[17 of 46 positions shown; findings below may reference images not displayed]

RADIATION DOSE REDUCTION: This exam was performed according to the
departmental dose-optimization program which includes automated
exposure control, adjustment of the mA and/or kV according to
patient size and/or use of iterative reconstruction technique.

CONTRAST:  100mL OMNIPAQUE IOHEXOL 300 MG/ML  SOLN
FINDINGS: Lower Chest: No acute findings.

Hepatobiliary: No hepatic masses identified. Gallbladder is
unremarkable. No evidence of biliary ductal dilatation.

Pancreas:  No mass or inflammatory changes.

Spleen: Within normal limits in size and appearance.

Adrenals/Urinary Tract: No masses identified. No evidence of
ureteral calculi or hydronephrosis.

Stomach/Bowel: No evidence of obstruction, inflammatory process or
abnormal fluid collections. No signs of appendicitis identified.

Vascular/Lymphatic: No pathologically enlarged lymph nodes. No acute
vascular findings.

Reproductive:  No mass or other significant abnormality.

Other:  None.

Musculoskeletal:  No suspicious bone lesions identified.
IMPRESSION: Negative. No evidence of appendicitis or other significant
abnormality.

## 2023-09-23 ENCOUNTER — Other Ambulatory Visit: Payer: Self-pay

## 2023-09-23 ENCOUNTER — Emergency Department (HOSPITAL_BASED_OUTPATIENT_CLINIC_OR_DEPARTMENT_OTHER)

## 2023-09-23 ENCOUNTER — Emergency Department (HOSPITAL_BASED_OUTPATIENT_CLINIC_OR_DEPARTMENT_OTHER)
Admission: EM | Admit: 2023-09-23 | Discharge: 2023-09-23 | Disposition: A | Discharge status: Home / Self Care | Attending: Emergency Medicine | Admitting: Emergency Medicine

## 2023-09-23 ENCOUNTER — Encounter (HOSPITAL_BASED_OUTPATIENT_CLINIC_OR_DEPARTMENT_OTHER): Payer: Self-pay

## 2023-09-23 DIAGNOSIS — I10 Essential (primary) hypertension: Secondary | ICD-10-CM | POA: Insufficient documentation

## 2023-09-23 DIAGNOSIS — M25511 Pain in right shoulder: Secondary | ICD-10-CM | POA: Diagnosis present

## 2023-09-23 DIAGNOSIS — Z79899 Other long term (current) drug therapy: Secondary | ICD-10-CM | POA: Insufficient documentation

## 2023-09-23 MED ORDER — METHYLPREDNISOLONE 4 MG PO TBPK: ORAL_TABLET | ORAL | 0 refills | Status: AC

## 2023-09-23 MED ORDER — DEXAMETHASONE 4 MG PO TABS
10.0000 mg | ORAL_TABLET | Freq: Once | ORAL | Status: AC
  Administered 2023-09-23: 10 mg via ORAL
  Filled 2023-09-23: qty 3

## 2023-09-23 NOTE — ED Triage Notes (Signed)
 Patient here POV from Home.  Endorses pain to right shoulder (deltoid) that began when patient awoke. Pain has worsened since and now pain radiates to elbow and forearm. Worse with movement. Previous Shoulder injury. No known trauma but notes shoulder presses yesterday and felt a pop to right shoulder.   NAD noted during Triage. A&Ox4. GCS 15. Ambulatory.

## 2023-09-23 NOTE — ED Provider Notes (Signed)
 [place] [facility] AT [facility] Provider Note   CSN: [id number] Arrival date & time: 09/23/23  1749     Patient presents with: Arm Pain   Glenn Martin is a 43 y.o. male.   Patient here right shoulder pain.  Nothing makes it worse or better.  Previous injury in this area.  Felt maybe a pop yesterday while doing overhead press.  Denies any weakness numbness tingling.  No neck pain no headache.  History of hypertension high cholesterol.  He felt fine after the workout.  He felt good this morning with good range of motion but as the day went on he felt increased tightness in the right shoulder with pain with movement.  The history is provided by the patient.       Prior to Admission medications   Medication Sig Start Date End Date Taking? Authorizing Provider  methylPREDNISolone  (MEDROL  DOSEPAK) 4 MG TBPK tablet Follow package insert 09/23/23  Yes Malyna Budney, DO  omeprazole (PRILOSEC) 40 MG capsule Take 40 mg by mouth 2 (two) times daily.  02/04/20  [provider]  albuterol  (PROVENTIL  HFA;VENTOLIN  HFA) 108 (90 Base) MCG/ACT inhaler Inhale 2 puffs into the lungs every 4 (four) hours as needed for wheezing or shortness of breath. 02/18/18   Windle Almarie ORN, PA-C  amLODipine (NORVASC) 5 MG tablet Take 10 mg by mouth daily.     [provider]  amoxicillin -clavulanate (AUGMENTIN) 875-125 MG tablet Take 1 tablet by mouth 2 (two) times daily. 06/18/19   [provider]  atenolol (TENORMIN) 25 MG tablet Take 25 mg by mouth daily.  07/08/17   [provider]  cetirizine (ZYRTEC) 10 MG tablet Take 10 mg by mouth daily.    [provider]  chlorthalidone (HYGROTON) 50 MG tablet Take 50 mg by mouth daily. 07/05/17   [provider]  diclofenac (VOLTAREN) 75 MG EC tablet Take 75 mg by mouth 2 (two) times daily. 05/19/19   [provider]  dicyclomine  (BENTYL ) 20 MG tablet Take 1 tablet (20 mg total) by mouth  2 (two) times daily. 06/02/21   Floyd, Dan, DO  ezetimibe (ZETIA) 10 MG tablet Take by mouth. 11/12/17   [provider]  gabapentin (NEURONTIN) 300 MG capsule 2 caps in morning, 2 caps at bedtime. 11/18/17   [provider]  lidocaine  (XYLOCAINE ) 2 % solution Use as directed 15 mLs in the mouth or throat as needed for mouth pain. 02/04/20   Henderly, Britni A, PA-C  linaclotide (LINZESS) 72 MCG capsule Take 72 mcg by mouth daily before breakfast.    [provider]  ondansetron  (ZOFRAN ) 4 MG tablet Take 1 tablet (4 mg total) by mouth every 6 (six) hours. 01/18/22   Ruthe Cornet, DO  pantoprazole  (PROTONIX ) 40 MG tablet Take by mouth. 07/26/18   [provider]  sucralfate  (CARAFATE ) 1 g tablet Take 1 tablet (1 g total) by mouth 4 (four) times daily -  with meals and at bedtime for 10 days. 02/04/20 02/14/20  Henderly, Britni A, PA-C  vitamin C (ASCORBIC ACID) 500 MG tablet Take 500 mg by mouth daily.    [provider]  Vitamin D, Ergocalciferol, (DRISDOL) 50000 units CAPS capsule Take 50,000 Units by mouth every 7 (seven) days. On mondays    [provider]  glycopyrrolate (ROBINUL) 2 MG tablet Take 2 mg by mouth daily.  02/04/20  [provider]  promethazine  (PHENERGAN ) 25 MG suppository Place 1 suppository (25 mg total)  rectally every 6 (six) hours as needed for nausea or vomiting. 06/30/19 02/04/20  Tegeler, Lonni PARAS, MD    Allergies: Atorvastatin, Other, Shellfish allergy, and Allopurinol    Review of Systems  Updated Vital Signs BP (!) 157/113 (BP Location: Left Arm)   Pulse 67   Temp 98.5 F (36.9 C)   Resp 18   Ht 5' 11 (1.803 m)   Wt (!) 164.2 kg   SpO2 95%   BMI 50.49 kg/m   Physical Exam Vitals and nursing note reviewed.  Constitutional:      General: He is not in acute distress.    Appearance: He is well-developed.  HENT:     Head: Normocephalic and atraumatic.  Eyes:     Conjunctiva/sclera: Conjunctivae  normal.  Cardiovascular:     Rate and Rhythm: Normal rate and regular rhythm.     Pulses: Normal pulses.     Heart sounds: Normal heart sounds. No murmur heard. Pulmonary:     Effort: Pulmonary effort is normal. No respiratory distress.     Breath sounds: Normal breath sounds.  Abdominal:     Palpations: Abdomen is soft.     Tenderness: There is no abdominal tenderness.  Musculoskeletal:        General: Tenderness present. No swelling.     Cervical back: Normal range of motion and neck supple.     Comments: Tenderness to the right shoulder area but no obvious deformity or swelling, decreased range of motion secondary to pain cannot really range of motion  Skin:    General: Skin is warm and dry.     Capillary Refill: Capillary refill takes less than 2 seconds.  Neurological:     General: No focal deficit present.     Mental Status: He is alert.     Sensory: No sensory deficit.     Motor: No weakness.  Psychiatric:        Mood and Affect: Mood normal.     (all labs ordered are listed, but only abnormal results are displayed) Labs Reviewed - No data to display  EKG: None  Radiology: DG Shoulder Right Result Date: 09/23/2023 CLINICAL DATA:  Right shoulder pain EXAM: RIGHT SHOULDER - 2+ VIEW COMPARISON:  X-ray right shoulder 12/19/2021 FINDINGS: There is no evidence of fracture or dislocation. There is no evidence of arthropathy or other focal bone abnormality. Soft tissues are unremarkable. IMPRESSION: Negative. Electronically Signed   By: Morgane  Naveau M.D.   On: 09/23/2023 18:47     Procedures   Medications Ordered in the ED  dexamethasone  (DECADRON ) tablet 10 mg (has no administration in time range)                                    Medical Decision Making Amount and/or Complexity of Data Reviewed Radiology: ordered.  Risk Prescription drug management.   Glenn Martin is here right shoulder pain.  He felt something pull yesterday while doing a workout in  the right shoulder.  He felt well though afterwards.  He had good range of motion.  Had good range of motion this morning but as the day has gone through today shoulders gotten tight more painful with movement of the right shoulder.  He is to the point where it is very difficult to move his right shoulder.  He is able to supinate and pronate.  He can flex and extend at the elbow little  bit as well.  He has had prior rotator cuff and bilateral shoulders.  Ultimately he is neurovascularly intact on exam.  I do think this could be some sort of spasm tendinitis.  Seems less likely to be a rupture of a tendon but possible.  He did have good range of motion yesterday and this morning after he heard the pop yesterday.  Overall we will have him follow-up with orthopedics closely.  Will put him in a sling for comfort.  Will put him on a Medrol  Dosepak.  Discharged in good condition.  X-ray was unremarkable per radiology report.  This chart was dictated using voice recognition software.  Despite best efforts to proofread,  errors can occur which can change the documentation meaning.      Final diagnoses:  Acute pain of right shoulder    ED Discharge Orders          Ordered    methylPREDNISolone  (MEDROL  DOSEPAK) 4 MG TBPK tablet        09/23/23 1921               Ruthe Cornet, DO 09/23/23 1923

## 2023-09-23 NOTE — Discharge Instructions (Signed)
 Take steroids as prescribed.  Take your next dose tomorrow.  Recommend 1000 mg of Tylenol  every 6 hours for pain as well.  Use the sling for comfort.  Do not do any heavy activity with your arm

## 2023-09-23 NOTE — ED Notes (Signed)
 Pt alert and oriented X 4 at the time of discharge. RR even and unlabored. No acute distress noted. Pt verbalized understanding of discharge instructions as discussed. Pt ambulatory to lobby at time of discharge.

## 2023-12-25 ENCOUNTER — Emergency Department (HOSPITAL_BASED_OUTPATIENT_CLINIC_OR_DEPARTMENT_OTHER)

## 2023-12-25 ENCOUNTER — Emergency Department (HOSPITAL_BASED_OUTPATIENT_CLINIC_OR_DEPARTMENT_OTHER)
Admission: EM | Admit: 2023-12-25 | Discharge: 2023-12-26 | Disposition: A | Attending: Emergency Medicine | Admitting: Emergency Medicine

## 2023-12-25 ENCOUNTER — Other Ambulatory Visit: Payer: Self-pay

## 2023-12-25 ENCOUNTER — Encounter (HOSPITAL_BASED_OUTPATIENT_CLINIC_OR_DEPARTMENT_OTHER): Payer: Self-pay | Admitting: Emergency Medicine

## 2023-12-25 DIAGNOSIS — Z87891 Personal history of nicotine dependence: Secondary | ICD-10-CM | POA: Insufficient documentation

## 2023-12-25 DIAGNOSIS — I10 Essential (primary) hypertension: Secondary | ICD-10-CM | POA: Insufficient documentation

## 2023-12-25 DIAGNOSIS — R112 Nausea with vomiting, unspecified: Secondary | ICD-10-CM | POA: Insufficient documentation

## 2023-12-25 DIAGNOSIS — R109 Unspecified abdominal pain: Secondary | ICD-10-CM | POA: Insufficient documentation

## 2023-12-25 DIAGNOSIS — R197 Diarrhea, unspecified: Secondary | ICD-10-CM | POA: Diagnosis not present

## 2023-12-25 DIAGNOSIS — Z79899 Other long term (current) drug therapy: Secondary | ICD-10-CM | POA: Insufficient documentation

## 2023-12-25 LAB — COMPREHENSIVE METABOLIC PANEL WITH GFR
ALT: 65 U/L — ABNORMAL HIGH (ref 0–44)
AST: 34 U/L (ref 15–41)
Albumin: 4.6 g/dL (ref 3.5–5.0)
Alkaline Phosphatase: 78 U/L (ref 38–126)
Anion gap: 11 (ref 5–15)
BUN: 12 mg/dL (ref 6–20)
CO2: 26 mmol/L (ref 22–32)
Calcium: 9 mg/dL (ref 8.9–10.3)
Chloride: 102 mmol/L (ref 98–111)
Creatinine, Ser: 1.03 mg/dL (ref 0.61–1.24)
GFR, Estimated: >60 mL/min
Glucose, Bld: 108 mg/dL — ABNORMAL HIGH (ref 70–99)
Potassium: 4.1 mmol/L (ref 3.5–5.1)
Sodium: 138 mmol/L (ref 135–145)
Total Bilirubin: 0.7 mg/dL (ref 0.0–1.2)
Total Protein: 7.2 g/dL (ref 6.5–8.1)

## 2023-12-25 LAB — URINALYSIS, ROUTINE W REFLEX MICROSCOPIC
Bilirubin Urine: NEGATIVE
Glucose, UA: NEGATIVE mg/dL
Hgb urine dipstick: NEGATIVE
Ketones, ur: NEGATIVE mg/dL
Leukocytes,Ua: NEGATIVE
Nitrite: NEGATIVE
Protein, ur: NEGATIVE mg/dL
Specific Gravity, Urine: 1.025 (ref 1.005–1.030)
pH: 6.5 (ref 5.0–8.0)

## 2023-12-25 LAB — CBC WITH DIFFERENTIAL/PLATELET
Abs Immature Granulocytes: 0.02 K/uL (ref 0.00–0.07)
Basophils Absolute: 0 K/uL (ref 0.0–0.1)
Basophils Relative: 0 %
Eosinophils Absolute: 0.2 K/uL (ref 0.0–0.5)
Eosinophils Relative: 3 %
HCT: 44.3 % (ref 39.0–52.0)
Hemoglobin: 14.3 g/dL (ref 13.0–17.0)
Immature Granulocytes: 0 %
Lymphocytes Relative: 11 %
Lymphs Abs: 0.8 K/uL (ref 0.7–4.0)
MCH: 26 pg (ref 26.0–34.0)
MCHC: 32.3 g/dL (ref 30.0–36.0)
MCV: 80.7 fL (ref 80.0–100.0)
Monocytes Absolute: 0.4 K/uL (ref 0.1–1.0)
Monocytes Relative: 6 %
Neutro Abs: 5.6 K/uL (ref 1.7–7.7)
Neutrophils Relative %: 80 %
Platelets: 253 K/uL (ref 150–400)
RBC: 5.49 MIL/uL (ref 4.22–5.81)
RDW: 15.1 % (ref 11.5–15.5)
WBC: 7.1 K/uL (ref 4.0–10.5)
nRBC: 0 % (ref 0.0–0.2)

## 2023-12-25 LAB — RESP PANEL BY RT-PCR (RSV, FLU A&B, COVID)  RVPGX2
Influenza A by PCR: NEGATIVE
Influenza B by PCR: NEGATIVE
Resp Syncytial Virus by PCR: NEGATIVE
SARS Coronavirus 2 by RT PCR: NEGATIVE

## 2023-12-25 LAB — LIPASE, BLOOD: Lipase: 16 U/L (ref 11–51)

## 2023-12-25 MED ORDER — ONDANSETRON HCL 4 MG/2ML IJ SOLN
4.0000 mg | Freq: Once | INTRAMUSCULAR | Status: AC
  Administered 2023-12-25: 4 mg via INTRAVENOUS
  Filled 2023-12-25: qty 2

## 2023-12-25 MED ORDER — SODIUM CHLORIDE 0.9 % IV BOLUS
1000.0000 mL | Freq: Once | INTRAVENOUS | Status: AC
  Administered 2023-12-25: 1000 mL via INTRAVENOUS

## 2023-12-25 MED ORDER — FAMOTIDINE IN NACL 20-0.9 MG/50ML-% IV SOLN
20.0000 mg | Freq: Once | INTRAVENOUS | Status: AC
  Administered 2023-12-25: 20 mg via INTRAVENOUS
  Filled 2023-12-25: qty 50

## 2023-12-25 MED ORDER — DIPHENHYDRAMINE HCL 50 MG/ML IJ SOLN
12.5000 mg | Freq: Once | INTRAMUSCULAR | Status: AC
  Administered 2023-12-26: 12.5 mg via INTRAVENOUS
  Filled 2023-12-25: qty 1

## 2023-12-25 MED ORDER — DICYCLOMINE HCL 20 MG PO TABS: 20.0000 mg | ORAL_TABLET | Freq: Two times a day (BID) | ORAL | 0 refills | Status: AC

## 2023-12-25 MED ORDER — IOHEXOL 300 MG/ML  SOLN
100.0000 mL | Freq: Once | INTRAMUSCULAR | Status: AC | PRN
  Administered 2023-12-25: 100 mL via INTRAVENOUS

## 2023-12-25 MED ORDER — MORPHINE SULFATE (PF) 4 MG/ML IV SOLN
4.0000 mg | Freq: Once | INTRAVENOUS | Status: AC
  Administered 2023-12-25: 4 mg via INTRAVENOUS
  Filled 2023-12-25: qty 1

## 2023-12-25 MED ORDER — ONDANSETRON HCL 4 MG PO TABS: 4.0000 mg | ORAL_TABLET | Freq: Three times a day (TID) | ORAL | 0 refills | Status: AC | PRN

## 2023-12-25 MED ORDER — SUCRALFATE 1 G PO TABS: 1.0000 g | ORAL_TABLET | Freq: Three times a day (TID) | ORAL | 0 refills | Status: AC

## 2023-12-25 MED ORDER — METOCLOPRAMIDE HCL 5 MG/ML IJ SOLN
5.0000 mg | Freq: Once | INTRAMUSCULAR | Status: AC
  Administered 2023-12-26: 5 mg via INTRAVENOUS
  Filled 2023-12-25: qty 2

## 2023-12-25 NOTE — Discharge Instructions (Signed)
 You should return to the hospital if you experience return of persistent nausea and vomiting that does not resolve and does not allow you to tolerate any food or fluids, persistent fevers for greater than 2-3 more days, increasing abdominal pain that persists despite medications, persistent diarrhea, dizziness, syncope (fainting), or for any other concerns.    Please return to the emergency department immediately for any new or concerning symptoms, or if you get worse.

## 2023-12-25 NOTE — ED Triage Notes (Signed)
 Pt c/o generalized abd pain, NVD since yesterday upon returning from the [place]

## 2023-12-25 NOTE — ED Provider Notes (Signed)
 "  EMERGENCY DEPARTMENT AT [facility] Provider Note  CSN: [id number] Arrival date & time: 12/25/23 1956  Chief Complaint(s) Abdominal Pain and Emesis  HPI Glenn Martin is a 43 y.o. male with past medical history as below, significant for HLD HTN, IBS, GERD who presents to the ED with complaint of nausea, vomiting, diarrhea abdominal cramping  Returned from a cruise yesterday, symptoms started yesterday, n/v/d/ abd pain/cramping. No change w/ UOP. Denies sick contacts, spouse not feeling unwell. Reports did eat a variety of unusual foods while on cruise. No medication PTA. No blood in stool or fevers  Past Medical History Past Medical History:  Diagnosis Date   GERD (gastroesophageal reflux disease)    High cholesterol    Hypertension    IBS (irritable bowel syndrome)    Obesity    Prediabetes    Seasonal allergies    There are no active problems to display for this patient.  Home Medication(s) Prior to Admission medications  Medication Sig Start Date End Date Taking? Authorizing Provider  dicyclomine  (BENTYL ) 20 MG tablet Take 1 tablet (20 mg total) by mouth 2 (two) times daily. 12/25/23  Yes Elnor Savant A, DO  ondansetron  (ZOFRAN ) 4 MG tablet Take 1 tablet (4 mg total) by mouth every 8 (eight) hours as needed for nausea or vomiting. 12/25/23  Yes Elnor Savant A, DO  sucralfate  (CARAFATE ) 1 g tablet Take 1 tablet (1 g total) by mouth with breakfast, with lunch, and with evening meal. 12/25/23 01/01/24 Yes Elnor Savant LABOR, DO  omeprazole (PRILOSEC) 40 MG capsule Take 40 mg by mouth 2 (two) times daily.  02/04/20  [provider]  albuterol  (PROVENTIL  HFA;VENTOLIN  HFA) 108 (90 Base) MCG/ACT inhaler Inhale 2 puffs into the lungs every 4 (four) hours as needed for wheezing or shortness of breath. 02/18/18   Windle Almarie ORN, PA-C  amLODipine (NORVASC) 5 MG tablet Take 10 mg by mouth daily.     [provider]  amoxicillin -clavulanate  (AUGMENTIN) 875-125 MG tablet Take 1 tablet by mouth 2 (two) times daily. 06/18/19   [provider]  atenolol (TENORMIN) 25 MG tablet Take 25 mg by mouth daily.  07/08/17   [provider]  cetirizine (ZYRTEC) 10 MG tablet Take 10 mg by mouth daily.    [provider]  chlorthalidone (HYGROTON) 50 MG tablet Take 50 mg by mouth daily. 07/05/17   [provider]  diclofenac (VOLTAREN) 75 MG EC tablet Take 75 mg by mouth 2 (two) times daily. 05/19/19   [provider]  ezetimibe (ZETIA) 10 MG tablet Take by mouth. 11/12/17   [provider]  gabapentin (NEURONTIN) 300 MG capsule 2 caps in morning, 2 caps at bedtime. 11/18/17   [provider]  lidocaine  (XYLOCAINE ) 2 % solution Use as directed 15 mLs in the mouth or throat as needed for mouth pain. 02/04/20   Henderly, Britni A, PA-C  linaclotide (LINZESS) 72 MCG capsule Take 72 mcg by mouth daily before breakfast.    [provider]  methylPREDNISolone  (MEDROL  DOSEPAK) 4 MG TBPK tablet Follow package insert 09/23/23   Curatolo, Adam, DO  pantoprazole  (PROTONIX ) 40 MG tablet Take by mouth. 07/26/18   [provider]  vitamin C (ASCORBIC ACID) 500 MG tablet Take 500 mg by mouth daily.    [provider]  Vitamin D, Ergocalciferol, (DRISDOL) 50000 units CAPS capsule Take 50,000 Units by mouth every 7 (seven) days. On mondays    [provider]  glycopyrrolate (ROBINUL) 2 MG tablet Take 2 mg by mouth daily.  02/04/20  [provider]  promethazine  (PHENERGAN ) 25 MG suppository Place 1 suppository (25 mg total) rectally every 6 (six) hours as needed for nausea or vomiting. 06/30/19 02/04/20  Tegeler, Lonni PARAS, MD                                                                                                                                    Past Surgical History Past Surgical History:  Procedure Laterality Date   HIP SURGERY Bilateral    SHOULDER  SURGERY     Family History History reviewed. No pertinent family history.  Social History Social History[1] Allergies Atorvastatin, Other, Shellfish allergy, and Allopurinol  Review of Systems A thorough review of systems was obtained and all systems are negative except as noted in the HPI and PMH.   Physical Exam Vital Signs  I have reviewed the triage vital signs BP (!) 150/90 (BP Location: Right Arm)   Pulse 93   Temp 98.7 F (37.1 C) (Oral)   Resp 20   Ht 5' 11 (1.803 m)   Wt (!) 162.4 kg   SpO2 96%   BMI 49.93 kg/m  Physical Exam Vitals and nursing note reviewed.  Constitutional:      General: He is not in acute distress.    Appearance: Normal appearance. He is well-developed. He is obese. He is not ill-appearing.  HENT:     Head: Normocephalic and atraumatic.     Right Ear: External ear normal.     Left Ear: External ear normal.     Mouth/Throat:     Mouth: Mucous membranes are moist.  Eyes:     General: No scleral icterus. Cardiovascular:     Rate and Rhythm: Normal rate.  Pulmonary:     Effort: Pulmonary effort is normal. No respiratory distress.     Breath sounds: Normal breath sounds.  Abdominal:     General: Abdomen is flat.     Palpations: Abdomen is soft.     Tenderness: There is no abdominal tenderness. There is no guarding.  Musculoskeletal:     Cervical back: No rigidity.     Right lower leg: No edema.     Left lower leg: No edema.  Skin:    General: Skin is warm and dry.     Capillary Refill: Capillary refill takes less than 2 seconds.     Coloration: Skin is not jaundiced or pale.  Neurological:     Mental Status: He is alert.  Psychiatric:        Mood and Affect: Mood normal.        Behavior: Behavior normal.     ED Results and Treatments Labs (all labs ordered are listed, but only abnormal results are displayed) Labs Reviewed  COMPREHENSIVE METABOLIC PANEL WITH GFR - Abnormal; Notable for the following components:      Result  Value   Glucose, Bld 108 (*)    ALT 65 (*)    All other components within normal limits  RESP PANEL BY RT-PCR (RSV, FLU A&B, COVID)  RVPGX2  LIPASE, BLOOD  URINALYSIS, ROUTINE W REFLEX MICROSCOPIC  CBC WITH DIFFERENTIAL/PLATELET                                                                                                                          Radiology CT ABDOMEN PELVIS W CONTRAST Result Date: 12/25/2023 EXAM: CT ABDOMEN AND PELVIS WITH CONTRAST 12/25/2023 10:10:00 PM TECHNIQUE: CT of the abdomen and pelvis was performed with the administration of 100 mL of iohexol  (OMNIPAQUE ) 300 MG/ML solution. Multiplanar reformatted images are provided for review. Automated exposure control, iterative reconstruction, and/or weight-based adjustment of the mA/kV was utilized to reduce the radiation dose to as low as reasonably achievable. COMPARISON: 01/18/2022 CLINICAL HISTORY: Nausea and vomiting following recent travel. FINDINGS: LOWER CHEST: No acute abnormality. LIVER: The liver is unremarkable. GALLBLADDER AND BILE DUCTS: Gallbladder is unremarkable. No biliary ductal dilatation. SPLEEN: No acute abnormality. PANCREAS: No acute abnormality. ADRENAL GLANDS: No acute abnormality. KIDNEYS, URETERS AND BLADDER: No stones in the kidneys or ureters. No hydronephrosis. No perinephric or periureteral stranding. Urinary bladder is unremarkable. GI AND BOWEL: Stomach demonstrates no acute abnormality. The appendix is within normal limits. No obstructive or inflammatory changes of the colon or small bowel are seen. There is no bowel obstruction. PERITONEUM AND RETROPERITONEUM: No ascites. No free air. VASCULATURE: Aorta is normal in caliber. LYMPH NODES: No lymphadenopathy. REPRODUCTIVE ORGANS: The prostate is within normal limits. BONES AND SOFT TISSUES: Postsurgical changes in the femoral necks are seen bilaterally. No acute osseous abnormality. No focal soft tissue abnormality. IMPRESSION: 1. No acute findings in  the abdomen or pelvis. Electronically signed by: Oneil Devonshire MD 12/25/2023 10:16 PM EST RP Workstation: [id number]    Pertinent labs & imaging results that were available during my care of the patient were reviewed by me and considered in my medical decision making (see MDM for details).  Medications Ordered in ED Medications  iohexol  (OMNIPAQUE ) 300 MG/ML solution 100 mL (100 mLs Intravenous Contrast Given 12/25/23 2211)  sodium chloride  0.9 % bolus 1,000 mL (1,000 mLs Intravenous New Bag/Given 12/25/23 2259)  ondansetron  (ZOFRAN ) injection 4 mg (4 mg Intravenous Given 12/25/23 2300)  famotidine  (PEPCID ) IVPB 20 mg premix (20 mg Intravenous New Bag/Given 12/25/23 2309)  morphine  (PF) 4 MG/ML injection 4 mg (4 mg Intravenous Given 12/25/23 2304)  metoCLOPramide  (REGLAN ) injection 5 mg (5 mg Intravenous Given 12/26/23 0004)  diphenhydrAMINE  (BENADRYL ) injection 12.5 mg (12.5 mg Intravenous Given 12/26/23 0002)  Procedures Procedures  (including critical care time)  Medical Decision Making / ED Course    Medical Decision Making:    [place] Marchiano is a 43 y.o. male  with past medical history as below, significant for HLD HTN, IBS, GERD who presents to the ED with complaint of nausea, vomiting, diarrhea abdominal cramping. The complaint involves an extensive differential diagnosis and also carries with it a high risk of complications and morbidity.  Serious etiology was considered. Ddx includes but is not limited to: acute appendicitis, renal colic, testicular torsion, urinary tract infection, prostatitis,  diverticulitis, small bowel obstruction, colitis, abdominal aortic aneurysm, gastroenteritis, constipation etc.   Complete initial physical exam performed, notably the patient was in NAD, abd soft.    Reviewed and confirmed nursing documentation for past  medical history, family history, social history.  Vital signs reviewed.    Abdominal cramping Nausea/vomiting/diarrhea > - abd soft - HDS - labs stable - CTAP stable - high suspicion for foodborne illness/ enteritis, possible norovirus  - give fluids/anti-emetic/analgesia - no fever, no hematochezia  Clinical Course as of 12/26/23 0008  Sat Dec 25, 2023  2343 Symptoms improved [SG]    Clinical Course User Index [SG] Elnor Jayson LABOR, DO   Patient presents with vomiting, diarrhea, and abdominal cramping. Sx suggestive of enteritis or food born illness. Surgical or other more serious etiology appears very unlikely. The patient is improved with ED treatment.   Handoff to incoming EDP pending fluids/po chall/recheck               Additional history obtained: -Additional history obtained from family -External records from outside source obtained and reviewed including: Chart review including previous notes, labs, imaging, consultation notes including  Primary care documentation    Lab Tests: -I ordered, reviewed, and interpreted labs.   The pertinent results include:   Labs Reviewed  COMPREHENSIVE METABOLIC PANEL WITH GFR - Abnormal; Notable for the following components:      Result Value   Glucose, Bld 108 (*)    ALT 65 (*)    All other components within normal limits  RESP PANEL BY RT-PCR (RSV, FLU A&B, COVID)  RVPGX2  LIPASE, BLOOD  URINALYSIS, ROUTINE W REFLEX MICROSCOPIC  CBC WITH DIFFERENTIAL/PLATELET    Notable for labs stable  EKG   EKG Interpretation Date/Time:    Ventricular Rate:    PR Interval:    QRS Duration:    QT Interval:    QTC Calculation:   R Axis:      Text Interpretation:           Imaging Studies ordered: I ordered imaging studies including CTAP I independently visualized the following imaging with scope of interpretation limited to determining acute life threatening conditions related to emergency care; findings noted  above I agree with the radiologist interpretation If any imaging was obtained with contrast I closely monitored patient for any possible adverse reaction a/w contrast administration in the emergency department   Medicines ordered and prescription drug management: Meds ordered this encounter  Medications   iohexol  (OMNIPAQUE ) 300 MG/ML solution 100 mL   sodium chloride  0.9 % bolus 1,000 mL   ondansetron  (ZOFRAN ) injection 4 mg   famotidine  (PEPCID ) IVPB 20 mg premix   morphine  (PF) 4 MG/ML injection 4 mg   metoCLOPramide  (REGLAN ) injection 5 mg   diphenhydrAMINE  (BENADRYL ) injection 12.5 mg   ondansetron  (ZOFRAN ) 4 MG tablet    Sig: Take 1 tablet (4 mg total) by mouth every 8 (eight) hours  as needed for nausea or vomiting.    Dispense:  12 tablet    Refill:  0   dicyclomine  (BENTYL ) 20 MG tablet    Sig: Take 1 tablet (20 mg total) by mouth 2 (two) times daily.    Dispense:  20 tablet    Refill:  0   sucralfate  (CARAFATE ) 1 g tablet    Sig: Take 1 tablet (1 g total) by mouth with breakfast, with lunch, and with evening meal.    Dispense:  21 tablet    Refill:  0    -I have reviewed the patients home medicines and have made adjustments as needed   Consultations Obtained: na   Cardiac Monitoring: Continuous pulse oximetry interpreted by myself, 100% on RA.    Social Determinants of Health:  Diagnosis or treatment significantly limited by social determinants of health: former smoker and obesity   Reevaluation: After the interventions noted above, I reevaluated the patient and found that they have improved  Co morbidities that complicate the patient evaluation  Past Medical History:  Diagnosis Date   GERD (gastroesophageal reflux disease)    High cholesterol    Hypertension    IBS (irritable bowel syndrome)    Obesity    Prediabetes    Seasonal allergies       Dispostion: Disposition decision including need for hospitalization was considered, and patient  disposition pending at time of sign out.    Final Clinical Impression(s) / ED Diagnoses Final diagnoses:  Nausea vomiting and diarrhea         [1]  Social History Tobacco Use   Smoking status: Former    Types: Cigarettes   Smokeless tobacco: Never   Tobacco comments:    1 pack/week  Vaping Use   Vaping status: Never Used  Substance Use Topics   Alcohol use: Not Currently    Comment: occ   Drug use: No     Elnor Jayson LABOR, DO 12/26/23 0008  "
